# Patient Record
Sex: Female | Born: 1965
Health system: Southern US, Community
[De-identification: ages and names within clinical notes are randomized; demographics above are authoritative.]

## PROBLEM LIST (undated history)

## (undated) DIAGNOSIS — Z9889 Other specified postprocedural states: Secondary | ICD-10-CM

## (undated) DIAGNOSIS — R112 Nausea with vomiting, unspecified: Secondary | ICD-10-CM

## (undated) HISTORY — PX: TUBAL LIGATION: SHX77

## (undated) HISTORY — PX: APPENDECTOMY: SHX54

---

## 2006-03-21 ENCOUNTER — Ambulatory Visit (HOSPITAL_COMMUNITY): Admission: RE | Admit: 2006-03-21 | Discharge: 2006-03-21 | Payer: Self-pay | Admitting: *Deleted

## 2006-03-22 ENCOUNTER — Other Ambulatory Visit: Admission: RE | Admit: 2006-03-22 | Discharge: 2006-03-22 | Payer: Self-pay | Admitting: Family Medicine

## 2008-03-26 ENCOUNTER — Ambulatory Visit (HOSPITAL_COMMUNITY): Admission: RE | Admit: 2008-03-26 | Discharge: 2008-03-26 | Payer: Self-pay | Admitting: Family Medicine

## 2014-11-10 ENCOUNTER — Other Ambulatory Visit: Payer: Self-pay | Admitting: Gastroenterology

## 2014-11-19 ENCOUNTER — Other Ambulatory Visit: Payer: Self-pay | Admitting: Family Medicine

## 2014-11-19 DIAGNOSIS — R109 Unspecified abdominal pain: Secondary | ICD-10-CM

## 2014-11-21 ENCOUNTER — Ambulatory Visit
Admission: RE | Admit: 2014-11-21 | Discharge: 2014-11-21 | Disposition: A | Discharge status: Home / Self Care | Payer: BLUE CROSS/BLUE SHIELD | Source: Ambulatory Visit | Attending: Family Medicine | Admitting: Family Medicine

## 2014-11-21 DIAGNOSIS — R109 Unspecified abdominal pain: Secondary | ICD-10-CM

## 2014-11-27 ENCOUNTER — Encounter (HOSPITAL_COMMUNITY): Payer: Self-pay | Admitting: *Deleted

## 2014-12-11 NOTE — H&P (Signed)
  Stephanie Shepard HPI: The patient reported an issues with hematochezia two months ago. She noticed some blood on the toilet tissue and no further bleeding since that time. There is no issues with diarrhea or constipation on a routine basis. In the recent past she also complained of right upper quadrant pain that precipitated diarrhea. She thinks she had 7 of these attacks in the past and work up for her gallbladder was negative. An ultrasound of the abdomen was performed, but it was negative. Her family history is significant for one sister with pancreatic cancer, one sister with pancreatic cancer and breast cancer Evelena Peat(BRACA-), and her mother with duodenal cancer. Her sisters are close in age with her.  Past Medical History  Diagnosis Date  . PONV (postoperative nausea and vomiting)     Past Surgical History  Procedure Laterality Date  . Appendectomy    . Cesarean section    . Tubal ligation      History reviewed. No pertinent family history.  Social History:  reports that she has never smoked. She has never used smokeless tobacco. She reports that she does not drink alcohol or use illicit drugs.  Allergies:  Allergies  Allergen Reactions  . Penicillins Hives    Has patient had a PCN reaction causing immediate rash, facial/tongue/throat swelling, SOB or lightheadedness with hypotension: HIVES (a week after taking the drug) Has patient had a PCN reaction causing severe rash involving mucus membranes or skin necrosis: No Has patient had a PCN reaction that required hospitalization No Has patient had a PCN reaction occurring within the last 10 years: no If all of the above answers are "NO", then may proceed with Cephalosporin use.   . Shrimp [Shellfish Allergy] Other (See Comments)    Throat swelling    Medications: Scheduled: Continuous:  No results found for this or any previous visit (from the past 24 hour(s)).   No results found.  ROS:  As stated above in the HPI otherwise  negative.  There were no vitals taken for this visit.    PE: Gen: NAD, Alert and Oriented HEENT:  [place]/AT, EOMI Neck: Supple, no LAD Lungs: CTA Bilaterally CV: RRR without M/G/R ABM: Soft, NTND, +BS Ext: No C/C/E  Assessment/Plan: 1) RUQ pain and family history of pancreatic cancer as well as duodenal cancer - EUS.  Margarite Vessel D 12/11/2014, 3:45 PM

## 2014-12-12 ENCOUNTER — Ambulatory Visit (HOSPITAL_COMMUNITY)
Admission: RE | Admit: 2014-12-12 | Discharge: 2014-12-12 | Disposition: A | Discharge status: Home / Self Care | Payer: BLUE CROSS/BLUE SHIELD | Source: Ambulatory Visit | Attending: Gastroenterology | Admitting: Gastroenterology

## 2014-12-12 ENCOUNTER — Encounter (HOSPITAL_COMMUNITY): Payer: Self-pay | Admitting: *Deleted

## 2014-12-12 ENCOUNTER — Encounter (HOSPITAL_COMMUNITY)
Admission: RE | Disposition: A | Discharge status: Home / Self Care | Payer: Self-pay | Source: Ambulatory Visit | Attending: Gastroenterology

## 2014-12-12 ENCOUNTER — Ambulatory Visit (HOSPITAL_COMMUNITY): Payer: BLUE CROSS/BLUE SHIELD | Admitting: Anesthesiology

## 2014-12-12 DIAGNOSIS — R1011 Right upper quadrant pain: Secondary | ICD-10-CM | POA: Insufficient documentation

## 2014-12-12 DIAGNOSIS — Z800 Family history of malignant neoplasm of digestive organs: Secondary | ICD-10-CM | POA: Insufficient documentation

## 2014-12-12 DIAGNOSIS — Z79899 Other long term (current) drug therapy: Secondary | ICD-10-CM | POA: Insufficient documentation

## 2014-12-12 DIAGNOSIS — K317 Polyp of stomach and duodenum: Secondary | ICD-10-CM | POA: Insufficient documentation

## 2014-12-12 DIAGNOSIS — Z91013 Allergy to seafood: Secondary | ICD-10-CM | POA: Insufficient documentation

## 2014-12-12 DIAGNOSIS — K219 Gastro-esophageal reflux disease without esophagitis: Secondary | ICD-10-CM | POA: Insufficient documentation

## 2014-12-12 DIAGNOSIS — Z880 Allergy status to penicillin: Secondary | ICD-10-CM | POA: Diagnosis not present

## 2014-12-12 HISTORY — DX: Nausea with vomiting, unspecified: R11.2

## 2014-12-12 HISTORY — DX: Nausea with vomiting, unspecified: Z98.890

## 2014-12-12 HISTORY — PX: EUS: SHX5427

## 2014-12-12 SURGERY — ULTRASOUND, UPPER GI TRACT, ENDOSCOPIC
Anesthesia: Monitor Anesthesia Care

## 2014-12-12 MED ORDER — MIDAZOLAM HCL 5 MG/5ML IJ SOLN
INTRAMUSCULAR | Status: DC | PRN
  Administered 2014-12-12: 2 mg via INTRAVENOUS

## 2014-12-12 MED ORDER — PROPOFOL 500 MG/50ML IV EMUL
INTRAVENOUS | Status: DC | PRN
  Administered 2014-12-12: 100 ug/kg/min via INTRAVENOUS

## 2014-12-12 MED ORDER — MIDAZOLAM HCL 2 MG/2ML IJ SOLN
INTRAMUSCULAR | Status: AC
  Filled 2014-12-12: qty 2

## 2014-12-12 MED ORDER — PROPOFOL 10 MG/ML IV BOLUS
INTRAVENOUS | Status: AC
  Filled 2014-12-12: qty 40

## 2014-12-12 MED ORDER — LACTATED RINGERS IV SOLN
INTRAVENOUS | Status: DC
  Administered 2014-12-12: 1000 mL via INTRAVENOUS

## 2014-12-12 MED ORDER — SODIUM CHLORIDE 0.9 % IV SOLN: INTRAVENOUS | Status: DC

## 2014-12-12 MED ORDER — PROPOFOL 10 MG/ML IV BOLUS
INTRAVENOUS | Status: DC | PRN
  Administered 2014-12-12: 50 mg via INTRAVENOUS
  Administered 2014-12-12 (×2): 20 mg via INTRAVENOUS
  Administered 2014-12-12: 50 mg via INTRAVENOUS

## 2014-12-12 NOTE — Transfer of Care (Signed)
Immediate Anesthesia Transfer of Care Note  Patient: Stephanie Shepard  Procedure(s) Performed: Procedure(s): FULL UPPER ENDOSCOPIC ULTRASOUND (EUS) RADIAL (N/A)  Patient Location: PACU  Anesthesia Type:MAC  Level of Consciousness:  sedated, patient cooperative and responds to stimulation  Airway & Oxygen Therapy:Patient Spontanous Breathing and Patient connected to face mask oxgen  Post-op Assessment:  Report given to PACU RN and Post -op Vital signs reviewed and stable  Post vital signs:  Reviewed and stable  Last Vitals:  Filed Vitals:   12/12/14 0959  BP: 134/75  Pulse: 67  Temp: 36.4 C  Resp: 16    Complications: No apparent anesthesia complications

## 2014-12-12 NOTE — Anesthesia Postprocedure Evaluation (Signed)
Anesthesia Post Note  Patient: Stephanie Shepard  Procedure(s) Performed: Procedure(s) (LRB): FULL UPPER ENDOSCOPIC ULTRASOUND (EUS) RADIAL (N/A)  Patient location during evaluation: PACU Anesthesia Type: MAC Level of consciousness: awake and alert Pain management: pain level controlled Vital Signs Assessment: post-procedure vital signs reviewed and stable Respiratory status: spontaneous breathing Cardiovascular status: blood pressure returned to baseline Anesthetic complications: no    Last Vitals:  Filed Vitals:   12/12/14 1310 12/12/14 1320  BP: 104/69 106/63  Pulse: 53 53  Temp:    Resp: 15 19    Last Pain: There were no vitals filed for this visit.               Kennieth RadFitzgerald, Kordell Jafri E

## 2014-12-12 NOTE — Discharge Instructions (Signed)

## 2014-12-12 NOTE — Op Note (Signed)
[facility] [address] [place], [address]   UPPER ENDOSCOPIC ULTRASOUND PROCEDURE REPORT     EXAM DATE: 12/12/2014  PATIENT NAME:          Stephanie Shepard, Stephanie Shepard          MR#:        [id number]  BIRTHDATE:       1965-08-22     VISIT #:     [id number] ATTENDING:     Jeani Hawking, MD     STATUS:     outpatient ASSISTANT:      Omelia Blackwater and Runell Gess MD: ASA CLASS:        Class I  INDICATIONS:  The patient is a 49 yr old female here for a lower endoscopic ultrasound due to RUQ pain and family history of duodenal and pancreatic cancer. PROCEDURE PERFORMED:     EUS and EGD with snare polypectomy   MEDICATIONS:     Monitored anesthesia care  CONSENT: The patient understands the risks and benefits of the procedure and understands that these risks include, but are not limited to: sedation, allergic reaction, infection, perforation and/or bleeding. Alternative means of evaluation and treatment include, among others: physical exam, x-rays, and/or surgical intervention. The patient elects to proceed with this endoscopic procedure.  DESCRIPTION OF PROCEDURE: During intra-op preparation period all mechanical & medical equipment was checked for proper function. Hand hygiene and appropriate measures for infection prevention was taken. After the risks, benefits and alternatives of the procedure were thoroughly explained, Informed consent was verified, confirmed and timeout was successfully executed by the treatment team. The patient was then placed in the left, lateral, decubitus position and IV sedation was administered. Throughout the procedure, the patients blood pressure, pulse and oxygen saturations were monitored continuously. Under direct visualization, the    was introduced through the mouth and advanced to the second portion of the duodenum.  Water was used as necessary to provide an acoustic interface. The pulse, BP, and O2 saturation  were monitored and documented by the physician and nursing staff throughout the procedure. Upon completion of the imaging, water was removed and the patient was then discharged to recovery in stable condition with the appropriate post procedure care. Estimated blood loss is zero unless otherwise noted in this procedure report.   FINDINGS: A close and careful examination of the upper GI structures were performed.  The pancreas was normal in echotexture in the head, uncinate, neck, body and tail (Images 1-5).  No evidence of any masses or cystic lesions.  The PD and CBD were small, but normal in caliber.  The liver was normal.  The gallbladder was visualized, but the image was hazy (Image 13).  However, there was no evidence of any overt abnormalities.  A sharp angulation was noted from D1 to D2 and the echoendoscope was not able to navigate through the area safely.  An upper adult endoscope was then used. The duodenal lumen was normal and the sharp angulation was simply an anatomical variation.  In the gastric lumen several polyps were identified measuring 3-4 mm.  Several representative samples were obrtained with a snare.  This is most likely from her use of omeprazole.Marland Kitchen    ADVERSE EVENTS:     There were no immediate complications. IMPRESSIONS:     1) Normal pancreas and hepatobiliary system. 2) Gastric polyps.  RECOMMENDATIONS:     1) Follow up biopsies.  ___________________________________ Jeani Hawking, MD eSigned:  Jeani Hawking, MD  12/12/2014 12:55 PM   cc:      PATIENT NAME:  Stephanie Shepard, Stephanie Shepard MR#: [id number]

## 2014-12-12 NOTE — Anesthesia Preprocedure Evaluation (Signed)
Anesthesia Evaluation  Patient identified by MRN, date of birth, ID band Patient awake    Reviewed: Allergy & Precautions, NPO status , Patient's Chart, lab work & pertinent test results  History of Anesthesia Complications (+) PONV  Airway Mallampati: I  TM Distance: >3 FB Neck ROM: Full    Dental   Pulmonary neg pulmonary ROS,    breath sounds clear to auscultation       Cardiovascular negative cardio ROS   Rhythm:Regular Rate:Normal     Neuro/Psych negative neurological ROS     GI/Hepatic negative GI ROS, Neg liver ROS,   Endo/Other  negative endocrine ROS  Renal/GU negative Renal ROS     Musculoskeletal negative musculoskeletal ROS (+)   Abdominal   Peds  Hematology negative hematology ROS (+)   Anesthesia Other Findings   Reproductive/Obstetrics                            No results found for: WBC, HGB, HCT, MCV, PLT No results found for: CREATININE, BUN, NA, K, CL, CO2  Anesthesia Physical Anesthesia Plan  ASA: I  Anesthesia Plan: MAC   Post-op Pain Management:    Induction: Intravenous  Airway Management Planned: Natural Airway and Nasal Cannula  Additional Equipment:   Intra-op Plan:   Post-operative Plan:   Informed Consent: I have reviewed the patients History and Physical, chart, labs and discussed the procedure including the risks, benefits and alternatives for the proposed anesthesia with the patient or authorized representative who has indicated his/her understanding and acceptance.     Plan Discussed with: CRNA  Anesthesia Plan Comments:         Anesthesia Quick Evaluation

## 2014-12-15 ENCOUNTER — Encounter (HOSPITAL_COMMUNITY): Payer: Self-pay | Admitting: Gastroenterology

## 2015-04-28 ENCOUNTER — Ambulatory Visit
Admission: RE | Admit: 2015-04-28 | Discharge: 2015-04-28 | Disposition: A | Discharge status: Home / Self Care | Payer: BLUE CROSS/BLUE SHIELD | Source: Ambulatory Visit | Attending: Family Medicine | Admitting: Family Medicine

## 2015-04-28 ENCOUNTER — Other Ambulatory Visit: Payer: Self-pay | Admitting: Family Medicine

## 2015-04-28 DIAGNOSIS — IMO0002 Reserved for concepts with insufficient information to code with codable children: Secondary | ICD-10-CM

## 2015-04-28 DIAGNOSIS — D229 Melanocytic nevi, unspecified: Secondary | ICD-10-CM | POA: Diagnosis not present

## 2015-04-28 DIAGNOSIS — R52 Pain, unspecified: Secondary | ICD-10-CM

## 2015-04-28 DIAGNOSIS — Z91018 Allergy to other foods: Secondary | ICD-10-CM | POA: Diagnosis not present

## 2015-04-28 DIAGNOSIS — R229 Localized swelling, mass and lump, unspecified: Principal | ICD-10-CM

## 2015-04-28 DIAGNOSIS — M2550 Pain in unspecified joint: Secondary | ICD-10-CM | POA: Diagnosis not present

## 2015-04-28 DIAGNOSIS — Z6824 Body mass index (BMI) 24.0-24.9, adult: Secondary | ICD-10-CM | POA: Diagnosis not present

## 2015-04-28 DIAGNOSIS — M79622 Pain in left upper arm: Secondary | ICD-10-CM | POA: Diagnosis not present

## 2015-06-17 DIAGNOSIS — N959 Unspecified menopausal and perimenopausal disorder: Secondary | ICD-10-CM | POA: Diagnosis not present

## 2015-06-17 DIAGNOSIS — N760 Acute vaginitis: Secondary | ICD-10-CM | POA: Diagnosis not present

## 2015-06-17 DIAGNOSIS — R634 Abnormal weight loss: Secondary | ICD-10-CM | POA: Diagnosis not present

## 2015-06-17 DIAGNOSIS — L659 Nonscarring hair loss, unspecified: Secondary | ICD-10-CM | POA: Diagnosis not present

## 2015-06-17 DIAGNOSIS — M2550 Pain in unspecified joint: Secondary | ICD-10-CM | POA: Diagnosis not present

## 2015-06-18 ENCOUNTER — Other Ambulatory Visit: Payer: Self-pay | Admitting: Family Medicine

## 2015-07-01 ENCOUNTER — Other Ambulatory Visit: Payer: Self-pay | Admitting: Family Medicine

## 2015-07-02 ENCOUNTER — Other Ambulatory Visit: Payer: Self-pay | Admitting: Family Medicine

## 2015-07-02 DIAGNOSIS — R229 Localized swelling, mass and lump, unspecified: Principal | ICD-10-CM

## 2015-07-02 DIAGNOSIS — IMO0002 Reserved for concepts with insufficient information to code with codable children: Secondary | ICD-10-CM

## 2015-07-10 ENCOUNTER — Ambulatory Visit
Admission: RE | Admit: 2015-07-10 | Discharge: 2015-07-10 | Disposition: A | Discharge status: Home / Self Care | Payer: BLUE CROSS/BLUE SHIELD | Source: Ambulatory Visit | Attending: Family Medicine | Admitting: Family Medicine

## 2015-07-10 DIAGNOSIS — IMO0002 Reserved for concepts with insufficient information to code with codable children: Secondary | ICD-10-CM

## 2015-07-10 DIAGNOSIS — R229 Localized swelling, mass and lump, unspecified: Principal | ICD-10-CM

## 2015-07-10 DIAGNOSIS — R2232 Localized swelling, mass and lump, left upper limb: Secondary | ICD-10-CM | POA: Diagnosis not present

## 2015-07-10 MED ORDER — GADOBENATE DIMEGLUMINE 529 MG/ML IV SOLN
12.0000 mL | Freq: Once | INTRAVENOUS | Status: AC | PRN
  Administered 2015-07-10: 12 mL via INTRAVENOUS

## 2015-08-06 DIAGNOSIS — Z23 Encounter for immunization: Secondary | ICD-10-CM | POA: Diagnosis not present

## 2015-10-07 DIAGNOSIS — L309 Dermatitis, unspecified: Secondary | ICD-10-CM | POA: Diagnosis not present

## 2015-10-07 DIAGNOSIS — L659 Nonscarring hair loss, unspecified: Secondary | ICD-10-CM | POA: Diagnosis not present

## 2015-10-07 DIAGNOSIS — Z23 Encounter for immunization: Secondary | ICD-10-CM | POA: Diagnosis not present

## 2015-10-27 DIAGNOSIS — Z1231 Encounter for screening mammogram for malignant neoplasm of breast: Secondary | ICD-10-CM | POA: Diagnosis not present

## 2015-10-27 DIAGNOSIS — Z01419 Encounter for gynecological examination (general) (routine) without abnormal findings: Secondary | ICD-10-CM | POA: Diagnosis not present

## 2015-10-27 DIAGNOSIS — Z6823 Body mass index (BMI) 23.0-23.9, adult: Secondary | ICD-10-CM | POA: Diagnosis not present

## 2015-12-09 DIAGNOSIS — L309 Dermatitis, unspecified: Secondary | ICD-10-CM | POA: Diagnosis not present

## 2015-12-09 DIAGNOSIS — Z6823 Body mass index (BMI) 23.0-23.9, adult: Secondary | ICD-10-CM | POA: Diagnosis not present

## 2015-12-09 DIAGNOSIS — L659 Nonscarring hair loss, unspecified: Secondary | ICD-10-CM | POA: Diagnosis not present

## 2016-02-02 DIAGNOSIS — Z136 Encounter for screening for cardiovascular disorders: Secondary | ICD-10-CM | POA: Diagnosis not present

## 2016-02-02 DIAGNOSIS — Z0000 Encounter for general adult medical examination without abnormal findings: Secondary | ICD-10-CM | POA: Diagnosis not present

## 2016-02-04 DIAGNOSIS — Z6824 Body mass index (BMI) 24.0-24.9, adult: Secondary | ICD-10-CM | POA: Diagnosis not present

## 2016-02-04 DIAGNOSIS — Z23 Encounter for immunization: Secondary | ICD-10-CM | POA: Diagnosis not present

## 2016-02-04 DIAGNOSIS — Z0000 Encounter for general adult medical examination without abnormal findings: Secondary | ICD-10-CM | POA: Diagnosis not present

## 2016-03-07 DIAGNOSIS — K5792 Diverticulitis of intestine, part unspecified, without perforation or abscess without bleeding: Secondary | ICD-10-CM | POA: Diagnosis not present

## 2016-03-07 DIAGNOSIS — N390 Urinary tract infection, site not specified: Secondary | ICD-10-CM | POA: Diagnosis not present

## 2016-03-07 DIAGNOSIS — Z6823 Body mass index (BMI) 23.0-23.9, adult: Secondary | ICD-10-CM | POA: Diagnosis not present

## 2016-03-09 DIAGNOSIS — N926 Irregular menstruation, unspecified: Secondary | ICD-10-CM | POA: Diagnosis not present

## 2016-03-09 DIAGNOSIS — K5792 Diverticulitis of intestine, part unspecified, without perforation or abscess without bleeding: Secondary | ICD-10-CM | POA: Diagnosis not present

## 2016-03-09 DIAGNOSIS — N390 Urinary tract infection, site not specified: Secondary | ICD-10-CM | POA: Diagnosis not present

## 2016-03-09 DIAGNOSIS — Z6823 Body mass index (BMI) 23.0-23.9, adult: Secondary | ICD-10-CM | POA: Diagnosis not present

## 2016-03-28 DIAGNOSIS — N926 Irregular menstruation, unspecified: Secondary | ICD-10-CM | POA: Diagnosis not present

## 2016-03-28 DIAGNOSIS — K5792 Diverticulitis of intestine, part unspecified, without perforation or abscess without bleeding: Secondary | ICD-10-CM | POA: Diagnosis not present

## 2016-03-28 DIAGNOSIS — G479 Sleep disorder, unspecified: Secondary | ICD-10-CM | POA: Diagnosis not present

## 2016-03-28 DIAGNOSIS — Z6824 Body mass index (BMI) 24.0-24.9, adult: Secondary | ICD-10-CM | POA: Diagnosis not present

## 2016-05-02 DIAGNOSIS — D72818 Other decreased white blood cell count: Secondary | ICD-10-CM | POA: Diagnosis not present

## 2016-05-05 DIAGNOSIS — N959 Unspecified menopausal and perimenopausal disorder: Secondary | ICD-10-CM | POA: Diagnosis not present

## 2016-05-05 DIAGNOSIS — Z733 Stress, not elsewhere classified: Secondary | ICD-10-CM | POA: Diagnosis not present

## 2016-05-05 DIAGNOSIS — N926 Irregular menstruation, unspecified: Secondary | ICD-10-CM | POA: Diagnosis not present

## 2016-05-05 DIAGNOSIS — G479 Sleep disorder, unspecified: Secondary | ICD-10-CM | POA: Diagnosis not present

## 2016-08-05 DIAGNOSIS — N390 Urinary tract infection, site not specified: Secondary | ICD-10-CM | POA: Diagnosis not present

## 2016-11-02 DIAGNOSIS — Z23 Encounter for immunization: Secondary | ICD-10-CM | POA: Diagnosis not present

## 2017-01-23 DIAGNOSIS — Z800 Family history of malignant neoplasm of digestive organs: Secondary | ICD-10-CM | POA: Diagnosis not present

## 2017-01-23 DIAGNOSIS — Z6824 Body mass index (BMI) 24.0-24.9, adult: Secondary | ICD-10-CM | POA: Diagnosis not present

## 2017-01-23 DIAGNOSIS — Z01419 Encounter for gynecological examination (general) (routine) without abnormal findings: Secondary | ICD-10-CM | POA: Diagnosis not present

## 2017-01-23 DIAGNOSIS — Z803 Family history of malignant neoplasm of breast: Secondary | ICD-10-CM | POA: Diagnosis not present

## 2017-01-23 DIAGNOSIS — Z808 Family history of malignant neoplasm of other organs or systems: Secondary | ICD-10-CM | POA: Diagnosis not present

## 2017-01-23 DIAGNOSIS — Z801 Family history of malignant neoplasm of trachea, bronchus and lung: Secondary | ICD-10-CM | POA: Diagnosis not present

## 2017-01-24 ENCOUNTER — Other Ambulatory Visit: Payer: Self-pay | Admitting: Obstetrics & Gynecology

## 2017-01-24 DIAGNOSIS — N644 Mastodynia: Secondary | ICD-10-CM

## 2017-01-27 ENCOUNTER — Ambulatory Visit: Payer: BLUE CROSS/BLUE SHIELD

## 2017-01-27 ENCOUNTER — Ambulatory Visit
Admission: RE | Admit: 2017-01-27 | Discharge: 2017-01-27 | Disposition: A | Discharge status: Home / Self Care | Payer: BLUE CROSS/BLUE SHIELD | Source: Ambulatory Visit | Attending: Obstetrics & Gynecology | Admitting: Obstetrics & Gynecology

## 2017-01-27 DIAGNOSIS — R922 Inconclusive mammogram: Secondary | ICD-10-CM | POA: Diagnosis not present

## 2017-01-27 DIAGNOSIS — N644 Mastodynia: Secondary | ICD-10-CM

## 2017-02-06 DIAGNOSIS — Z1329 Encounter for screening for other suspected endocrine disorder: Secondary | ICD-10-CM | POA: Diagnosis not present

## 2017-02-06 DIAGNOSIS — Z13220 Encounter for screening for lipoid disorders: Secondary | ICD-10-CM | POA: Diagnosis not present

## 2017-02-06 DIAGNOSIS — Z0000 Encounter for general adult medical examination without abnormal findings: Secondary | ICD-10-CM | POA: Diagnosis not present

## 2017-02-06 DIAGNOSIS — Z114 Encounter for screening for human immunodeficiency virus [HIV]: Secondary | ICD-10-CM | POA: Diagnosis not present

## 2017-02-09 DIAGNOSIS — Z0110 Encounter for examination of ears and hearing without abnormal findings: Secondary | ICD-10-CM | POA: Diagnosis not present

## 2017-02-09 DIAGNOSIS — D72818 Other decreased white blood cell count: Secondary | ICD-10-CM | POA: Diagnosis not present

## 2017-02-09 DIAGNOSIS — J069 Acute upper respiratory infection, unspecified: Secondary | ICD-10-CM | POA: Diagnosis not present

## 2017-02-09 DIAGNOSIS — H9313 Tinnitus, bilateral: Secondary | ICD-10-CM | POA: Diagnosis not present

## 2017-02-09 DIAGNOSIS — D72819 Decreased white blood cell count, unspecified: Secondary | ICD-10-CM | POA: Diagnosis not present

## 2017-02-09 DIAGNOSIS — Z0000 Encounter for general adult medical examination without abnormal findings: Secondary | ICD-10-CM | POA: Diagnosis not present

## 2017-02-09 DIAGNOSIS — H9193 Unspecified hearing loss, bilateral: Secondary | ICD-10-CM | POA: Diagnosis not present

## 2017-02-09 DIAGNOSIS — Z6823 Body mass index (BMI) 23.0-23.9, adult: Secondary | ICD-10-CM | POA: Diagnosis not present

## 2017-02-28 DIAGNOSIS — D72819 Decreased white blood cell count, unspecified: Secondary | ICD-10-CM | POA: Diagnosis not present

## 2017-03-02 DIAGNOSIS — D72819 Decreased white blood cell count, unspecified: Secondary | ICD-10-CM | POA: Diagnosis not present

## 2017-03-02 DIAGNOSIS — Z6823 Body mass index (BMI) 23.0-23.9, adult: Secondary | ICD-10-CM | POA: Diagnosis not present

## 2017-03-09 DIAGNOSIS — Z809 Family history of malignant neoplasm, unspecified: Secondary | ICD-10-CM | POA: Diagnosis not present

## 2017-03-09 DIAGNOSIS — N644 Mastodynia: Secondary | ICD-10-CM | POA: Diagnosis not present

## 2017-03-13 ENCOUNTER — Other Ambulatory Visit: Payer: Self-pay | Admitting: Obstetrics & Gynecology

## 2017-03-13 DIAGNOSIS — Z803 Family history of malignant neoplasm of breast: Secondary | ICD-10-CM

## 2017-03-23 DIAGNOSIS — H903 Sensorineural hearing loss, bilateral: Secondary | ICD-10-CM | POA: Diagnosis not present

## 2017-03-23 DIAGNOSIS — H9313 Tinnitus, bilateral: Secondary | ICD-10-CM | POA: Diagnosis not present

## 2017-04-17 DIAGNOSIS — Z6823 Body mass index (BMI) 23.0-23.9, adult: Secondary | ICD-10-CM | POA: Diagnosis not present

## 2017-04-17 DIAGNOSIS — N390 Urinary tract infection, site not specified: Secondary | ICD-10-CM | POA: Diagnosis not present

## 2017-05-11 DIAGNOSIS — R319 Hematuria, unspecified: Secondary | ICD-10-CM | POA: Diagnosis not present

## 2017-05-11 DIAGNOSIS — H9313 Tinnitus, bilateral: Secondary | ICD-10-CM | POA: Diagnosis not present

## 2017-05-11 DIAGNOSIS — N926 Irregular menstruation, unspecified: Secondary | ICD-10-CM | POA: Diagnosis not present

## 2017-05-11 DIAGNOSIS — J309 Allergic rhinitis, unspecified: Secondary | ICD-10-CM | POA: Diagnosis not present

## 2017-06-21 DIAGNOSIS — Z6823 Body mass index (BMI) 23.0-23.9, adult: Secondary | ICD-10-CM | POA: Diagnosis not present

## 2017-06-21 DIAGNOSIS — R319 Hematuria, unspecified: Secondary | ICD-10-CM | POA: Diagnosis not present

## 2017-06-21 DIAGNOSIS — N390 Urinary tract infection, site not specified: Secondary | ICD-10-CM | POA: Diagnosis not present

## 2017-07-24 ENCOUNTER — Ambulatory Visit
Admission: RE | Admit: 2017-07-24 | Discharge: 2017-07-24 | Disposition: A | Discharge status: Home / Self Care | Payer: BLUE CROSS/BLUE SHIELD | Source: Ambulatory Visit | Attending: Obstetrics & Gynecology | Admitting: Obstetrics & Gynecology

## 2017-07-24 DIAGNOSIS — N6011 Diffuse cystic mastopathy of right breast: Secondary | ICD-10-CM | POA: Diagnosis not present

## 2017-07-24 DIAGNOSIS — Z803 Family history of malignant neoplasm of breast: Secondary | ICD-10-CM

## 2017-07-24 DIAGNOSIS — N6012 Diffuse cystic mastopathy of left breast: Secondary | ICD-10-CM | POA: Diagnosis not present

## 2017-07-24 MED ORDER — GADOBENATE DIMEGLUMINE 529 MG/ML IV SOLN
12.0000 mL | Freq: Once | INTRAVENOUS | Status: AC | PRN
  Administered 2017-07-24: 12 mL via INTRAVENOUS

## 2017-07-28 ENCOUNTER — Other Ambulatory Visit: Payer: Self-pay | Admitting: Obstetrics & Gynecology

## 2017-07-28 DIAGNOSIS — R9389 Abnormal findings on diagnostic imaging of other specified body structures: Secondary | ICD-10-CM

## 2017-08-04 ENCOUNTER — Ambulatory Visit
Admission: RE | Admit: 2017-08-04 | Discharge: 2017-08-04 | Disposition: A | Discharge status: Home / Self Care | Payer: BLUE CROSS/BLUE SHIELD | Source: Ambulatory Visit | Attending: Obstetrics & Gynecology | Admitting: Obstetrics & Gynecology

## 2017-08-04 DIAGNOSIS — N6011 Diffuse cystic mastopathy of right breast: Secondary | ICD-10-CM | POA: Diagnosis not present

## 2017-08-04 DIAGNOSIS — N6489 Other specified disorders of breast: Secondary | ICD-10-CM | POA: Diagnosis not present

## 2017-08-04 DIAGNOSIS — R9389 Abnormal findings on diagnostic imaging of other specified body structures: Secondary | ICD-10-CM

## 2017-08-04 MED ORDER — GADOBENATE DIMEGLUMINE 529 MG/ML IV SOLN
12.0000 mL | Freq: Once | INTRAVENOUS | Status: AC | PRN
  Administered 2017-08-04: 12 mL via INTRAVENOUS

## 2017-08-24 DIAGNOSIS — R3121 Asymptomatic microscopic hematuria: Secondary | ICD-10-CM | POA: Diagnosis not present

## 2017-09-06 DIAGNOSIS — N200 Calculus of kidney: Secondary | ICD-10-CM | POA: Diagnosis not present

## 2017-09-06 DIAGNOSIS — N281 Cyst of kidney, acquired: Secondary | ICD-10-CM | POA: Diagnosis not present

## 2017-09-06 DIAGNOSIS — R3121 Asymptomatic microscopic hematuria: Secondary | ICD-10-CM | POA: Diagnosis not present

## 2017-09-07 DIAGNOSIS — R3121 Asymptomatic microscopic hematuria: Secondary | ICD-10-CM | POA: Diagnosis not present

## 2017-11-17 DIAGNOSIS — Z23 Encounter for immunization: Secondary | ICD-10-CM | POA: Diagnosis not present

## 2018-01-22 ENCOUNTER — Other Ambulatory Visit: Payer: Self-pay | Admitting: Obstetrics & Gynecology

## 2018-02-05 DIAGNOSIS — Z01419 Encounter for gynecological examination (general) (routine) without abnormal findings: Secondary | ICD-10-CM | POA: Diagnosis not present

## 2018-02-05 DIAGNOSIS — Z6824 Body mass index (BMI) 24.0-24.9, adult: Secondary | ICD-10-CM | POA: Diagnosis not present

## 2018-02-08 DIAGNOSIS — Z13220 Encounter for screening for lipoid disorders: Secondary | ICD-10-CM | POA: Diagnosis not present

## 2018-02-08 DIAGNOSIS — Z114 Encounter for screening for human immunodeficiency virus [HIV]: Secondary | ICD-10-CM | POA: Diagnosis not present

## 2018-02-08 DIAGNOSIS — Z1329 Encounter for screening for other suspected endocrine disorder: Secondary | ICD-10-CM | POA: Diagnosis not present

## 2018-02-08 DIAGNOSIS — Z0000 Encounter for general adult medical examination without abnormal findings: Secondary | ICD-10-CM | POA: Diagnosis not present

## 2018-02-14 DIAGNOSIS — Z0000 Encounter for general adult medical examination without abnormal findings: Secondary | ICD-10-CM | POA: Diagnosis not present

## 2018-02-14 DIAGNOSIS — Z6824 Body mass index (BMI) 24.0-24.9, adult: Secondary | ICD-10-CM | POA: Diagnosis not present

## 2018-02-14 DIAGNOSIS — Z23 Encounter for immunization: Secondary | ICD-10-CM | POA: Diagnosis not present

## 2018-03-05 ENCOUNTER — Other Ambulatory Visit: Payer: Self-pay | Admitting: Obstetrics & Gynecology

## 2018-03-05 DIAGNOSIS — Z1231 Encounter for screening mammogram for malignant neoplasm of breast: Secondary | ICD-10-CM

## 2018-03-07 ENCOUNTER — Ambulatory Visit
Admission: RE | Admit: 2018-03-07 | Discharge: 2018-03-07 | Disposition: A | Discharge status: Home / Self Care | Payer: BLUE CROSS/BLUE SHIELD | Source: Ambulatory Visit | Attending: Obstetrics & Gynecology | Admitting: Obstetrics & Gynecology

## 2018-03-07 DIAGNOSIS — Z1231 Encounter for screening mammogram for malignant neoplasm of breast: Secondary | ICD-10-CM

## 2018-03-08 ENCOUNTER — Other Ambulatory Visit: Payer: Self-pay | Admitting: Obstetrics & Gynecology

## 2018-03-08 DIAGNOSIS — R928 Other abnormal and inconclusive findings on diagnostic imaging of breast: Secondary | ICD-10-CM

## 2018-03-13 ENCOUNTER — Other Ambulatory Visit: Payer: Self-pay | Admitting: Obstetrics & Gynecology

## 2018-03-13 ENCOUNTER — Ambulatory Visit
Admission: RE | Admit: 2018-03-13 | Discharge: 2018-03-13 | Disposition: A | Discharge status: Home / Self Care | Payer: BLUE CROSS/BLUE SHIELD | Source: Ambulatory Visit | Attending: Obstetrics & Gynecology | Admitting: Obstetrics & Gynecology

## 2018-03-13 DIAGNOSIS — R928 Other abnormal and inconclusive findings on diagnostic imaging of breast: Secondary | ICD-10-CM

## 2018-03-13 DIAGNOSIS — R921 Mammographic calcification found on diagnostic imaging of breast: Secondary | ICD-10-CM | POA: Diagnosis not present

## 2018-03-13 DIAGNOSIS — Z803 Family history of malignant neoplasm of breast: Secondary | ICD-10-CM | POA: Diagnosis not present

## 2018-03-15 ENCOUNTER — Ambulatory Visit
Admission: RE | Admit: 2018-03-15 | Discharge: 2018-03-15 | Disposition: A | Discharge status: Home / Self Care | Payer: BLUE CROSS/BLUE SHIELD | Source: Ambulatory Visit | Attending: Obstetrics & Gynecology | Admitting: Obstetrics & Gynecology

## 2018-03-15 DIAGNOSIS — R921 Mammographic calcification found on diagnostic imaging of breast: Secondary | ICD-10-CM

## 2018-03-15 DIAGNOSIS — N6012 Diffuse cystic mastopathy of left breast: Secondary | ICD-10-CM | POA: Diagnosis not present

## 2018-03-27 DIAGNOSIS — H903 Sensorineural hearing loss, bilateral: Secondary | ICD-10-CM | POA: Diagnosis not present

## 2018-03-27 DIAGNOSIS — H9313 Tinnitus, bilateral: Secondary | ICD-10-CM | POA: Diagnosis not present

## 2018-08-03 ENCOUNTER — Other Ambulatory Visit: Payer: Self-pay

## 2018-08-03 ENCOUNTER — Encounter (HOSPITAL_COMMUNITY): Payer: Self-pay | Admitting: Emergency Medicine

## 2018-08-03 ENCOUNTER — Encounter (HOSPITAL_COMMUNITY): Payer: Self-pay

## 2018-08-03 ENCOUNTER — Emergency Department (HOSPITAL_COMMUNITY)
Admission: EM | Admit: 2018-08-03 | Discharge: 2018-08-03 | Disposition: A | Discharge status: Home / Self Care | Payer: BC Managed Care – PPO | Attending: Emergency Medicine | Admitting: Emergency Medicine

## 2018-08-03 ENCOUNTER — Emergency Department (HOSPITAL_COMMUNITY): Payer: BC Managed Care – PPO

## 2018-08-03 ENCOUNTER — Ambulatory Visit (HOSPITAL_COMMUNITY)
Admission: EM | Admit: 2018-08-03 | Discharge: 2018-08-03 | Disposition: A | Discharge status: Home / Self Care | Payer: BC Managed Care – PPO | Source: Home / Self Care | Attending: Family Medicine | Admitting: Family Medicine

## 2018-08-03 DIAGNOSIS — N201 Calculus of ureter: Secondary | ICD-10-CM

## 2018-08-03 DIAGNOSIS — R109 Unspecified abdominal pain: Secondary | ICD-10-CM

## 2018-08-03 DIAGNOSIS — N132 Hydronephrosis with renal and ureteral calculous obstruction: Secondary | ICD-10-CM | POA: Insufficient documentation

## 2018-08-03 DIAGNOSIS — R911 Solitary pulmonary nodule: Secondary | ICD-10-CM | POA: Diagnosis not present

## 2018-08-03 DIAGNOSIS — R1031 Right lower quadrant pain: Secondary | ICD-10-CM | POA: Diagnosis present

## 2018-08-03 DIAGNOSIS — Z880 Allergy status to penicillin: Secondary | ICD-10-CM | POA: Diagnosis not present

## 2018-08-03 DIAGNOSIS — R319 Hematuria, unspecified: Secondary | ICD-10-CM

## 2018-08-03 LAB — CBC WITH DIFFERENTIAL/PLATELET
Abs Immature Granulocytes: 0.02 10*3/uL (ref 0.00–0.07)
Basophils Absolute: 0 10*3/uL (ref 0.0–0.1)
Basophils Relative: 1 %
Eosinophils Absolute: 0 10*3/uL (ref 0.0–0.5)
Eosinophils Relative: 0 %
HCT: 38.1 % (ref 36.0–46.0)
Hemoglobin: 12.4 g/dL (ref 12.0–15.0)
Immature Granulocytes: 0 %
Lymphocytes Relative: 9 %
Lymphs Abs: 0.5 10*3/uL — ABNORMAL LOW (ref 0.7–4.0)
MCH: 28.7 pg (ref 26.0–34.0)
MCHC: 32.5 g/dL (ref 30.0–36.0)
MCV: 88.2 fL (ref 80.0–100.0)
Monocytes Absolute: 0.2 10*3/uL (ref 0.1–1.0)
Monocytes Relative: 4 %
Neutro Abs: 5 10*3/uL (ref 1.7–7.7)
Neutrophils Relative %: 86 %
Platelets: 214 10*3/uL (ref 150–400)
RBC: 4.32 MIL/uL (ref 3.87–5.11)
RDW: 12.7 % (ref 11.5–15.5)
WBC: 5.7 10*3/uL (ref 4.0–10.5)
nRBC: 0 % (ref 0.0–0.2)

## 2018-08-03 LAB — COMPREHENSIVE METABOLIC PANEL
ALT: 22 U/L (ref 0–44)
AST: 24 U/L (ref 15–41)
Albumin: 3.9 g/dL (ref 3.5–5.0)
Alkaline Phosphatase: 35 U/L — ABNORMAL LOW (ref 38–126)
Anion gap: 11 (ref 5–15)
BUN: 18 mg/dL (ref 6–20)
CO2: 24 mmol/L (ref 22–32)
Calcium: 9.3 mg/dL (ref 8.9–10.3)
Chloride: 107 mmol/L (ref 98–111)
Creatinine, Ser: 1.27 mg/dL — ABNORMAL HIGH (ref 0.44–1.00)
GFR calc Af Amer: 57 mL/min — ABNORMAL LOW (ref >60)
GFR calc non Af Amer: 49 mL/min — ABNORMAL LOW (ref >60)
Glucose, Bld: 119 mg/dL — ABNORMAL HIGH (ref 70–99)
Potassium: 4 mmol/L (ref 3.5–5.1)
Sodium: 142 mmol/L (ref 135–145)
Total Bilirubin: 0.8 mg/dL (ref 0.3–1.2)
Total Protein: 6.3 g/dL — ABNORMAL LOW (ref 6.5–8.1)

## 2018-08-03 LAB — POCT URINALYSIS DIP (DEVICE)
Glucose, UA: NEGATIVE mg/dL
Leukocytes,Ua: NEGATIVE
Nitrite: NEGATIVE
Protein, ur: >=300 mg/dL — AB
Specific Gravity, Urine: >=1.03 (ref 1.005–1.030)
Urobilinogen, UA: 0.2 mg/dL (ref 0.0–1.0)
pH: 5.5 (ref 5.0–8.0)

## 2018-08-03 LAB — URINALYSIS, ROUTINE W REFLEX MICROSCOPIC
Bilirubin Urine: NEGATIVE
Glucose, UA: NEGATIVE mg/dL
Ketones, ur: 5 mg/dL — AB
Leukocytes,Ua: NEGATIVE
Nitrite: NEGATIVE
Protein, ur: 100 mg/dL — AB
RBC / HPF: >50 RBC/hpf — ABNORMAL HIGH (ref 0–5)
Specific Gravity, Urine: 1.025 (ref 1.005–1.030)
pH: 5 (ref 5.0–8.0)

## 2018-08-03 LAB — LIPASE, BLOOD: Lipase: 30 U/L (ref 11–51)

## 2018-08-03 LAB — I-STAT BETA HCG BLOOD, ED (MC, WL, AP ONLY): I-stat hCG, quantitative: <5 m[IU]/mL (ref <5)

## 2018-08-03 MED ORDER — HYDROCODONE-ACETAMINOPHEN 5-325 MG PO TABS
1.0000 | ORAL_TABLET | Freq: Once | ORAL | Status: AC
  Administered 2018-08-03: 14:00:00 1 via ORAL
  Filled 2018-08-03: qty 1

## 2018-08-03 MED ORDER — ONDANSETRON HCL 4 MG/2ML IJ SOLN
4.0000 mg | Freq: Once | INTRAMUSCULAR | Status: AC
  Administered 2018-08-03: 4 mg via INTRAVENOUS
  Filled 2018-08-03: qty 2

## 2018-08-03 MED ORDER — HYDROCODONE-ACETAMINOPHEN 5-325 MG PO TABS
ORAL_TABLET | ORAL | Status: AC
  Filled 2018-08-03: qty 1

## 2018-08-03 MED ORDER — TAMSULOSIN HCL 0.4 MG PO CAPS: 0.4000 mg | ORAL_CAPSULE | Freq: Every day | ORAL | 0 refills | Status: DC

## 2018-08-03 MED ORDER — SODIUM CHLORIDE 0.9 % IV BOLUS
250.0000 mL | Freq: Once | INTRAVENOUS | Status: AC
  Administered 2018-08-03: 14:00:00 250 mL via INTRAVENOUS

## 2018-08-03 MED ORDER — KETOROLAC TROMETHAMINE 15 MG/ML IJ SOLN
15.0000 mg | Freq: Once | INTRAMUSCULAR | Status: AC
  Administered 2018-08-03: 15 mg via INTRAMUSCULAR
  Filled 2018-08-03: qty 1

## 2018-08-03 MED ORDER — HYDROCODONE-ACETAMINOPHEN 5-325 MG PO TABS
2.0000 | ORAL_TABLET | Freq: Once | ORAL | Status: AC
  Administered 2018-08-03: 2 via ORAL

## 2018-08-03 MED ORDER — ONDANSETRON 4 MG PO TBDP: 4.0000 mg | ORAL_TABLET | Freq: Three times a day (TID) | ORAL | 0 refills | Status: DC | PRN

## 2018-08-03 MED ORDER — HYDROCODONE-ACETAMINOPHEN 5-325 MG PO TABS: 1.0000 | ORAL_TABLET | Freq: Four times a day (QID) | ORAL | 0 refills | Status: DC | PRN

## 2018-08-03 MED ORDER — ONDANSETRON 4 MG PO TBDP
4.0000 mg | ORAL_TABLET | Freq: Once | ORAL | Status: AC
  Administered 2018-08-03: 4 mg via ORAL

## 2018-08-03 MED ORDER — MORPHINE SULFATE (PF) 4 MG/ML IV SOLN
4.0000 mg | Freq: Once | INTRAVENOUS | Status: AC
  Administered 2018-08-03: 11:00:00 4 mg via INTRAVENOUS
  Filled 2018-08-03: qty 1

## 2018-08-03 MED ORDER — ONDANSETRON 4 MG PO TBDP
ORAL_TABLET | ORAL | Status: AC
  Filled 2018-08-03: qty 1

## 2018-08-03 NOTE — ED Triage Notes (Signed)
Pt states "starting on Tuesday I haven't felt very well, I felt full, poor appetite, then I started having right flank pain that woke me up from out of my sleep." C/o nausea and vomited once this morning. Pt states she has not had any issue urinating. Denies hx of kidney stones.

## 2018-08-03 NOTE — ED Notes (Signed)
Pt back from CT

## 2018-08-03 NOTE — ED Provider Notes (Addendum)
[facility] Note   CSN: [id number] Arrival date & time: 08/03/18  0920    History   Chief Complaint Chief Complaint  Patient presents with   Flank Pain    HPI Stephanie Shepard is a 53 y.o. female presents today for kidney stone rule out from urgent care.  Patient reports sudden onset right flank pain that began at 3 AM this morning.  Describes a waxing and waning sharp throb severe in intensity without aggravating or alleviating factors.  Reports few episodes of hematuria.  Denies similar pain in the past.  She reports multiple episodes of nonbloody/nonbilious vomiting prior to arrival.   Denies fever/chills, diarrhea, chest pain/shortness of breath, fall/injury, numbness/weakness, tingling or any additional concerns. HPI  Past Medical History:  Diagnosis Date   PONV (postoperative nausea and vomiting)     There are no active problems to display for this patient.   Past Surgical History:  Procedure Laterality Date   APPENDECTOMY     CESAREAN SECTION     EUS N/A 12/12/2014   Procedure: FULL UPPER ENDOSCOPIC ULTRASOUND (EUS) RADIAL;  Surgeon: Jeani HawkingPatrick Hung, MD;  Location: [facility] ENDOSCOPY;  Service: Endoscopy;  Laterality: N/A;   TUBAL LIGATION       OB History   No obstetric history on file.      Home Medications    Prior to Admission medications   Medication Sig Start Date End Date Taking? Authorizing Provider  HYDROcodone-acetaminophen (NORCO/VICODIN) 5-325 MG tablet Take 1-2 tablets by mouth every 6 (six) hours as needed. 08/03/18   Harlene SaltsMorelli, Deina Lipsey A, PA-C  ondansetron (ZOFRAN ODT) 4 MG disintegrating tablet Take 1 tablet (4 mg total) by mouth every 8 (eight) hours as needed for nausea or vomiting. 08/03/18   Harlene SaltsMorelli, Drucilla Cumber A, PA-C  tamsulosin (FLOMAX) 0.4 MG CAPS capsule Take 1 capsule (0.4 mg total) by mouth daily. 08/03/18   Bill SalinasMorelli, Emillee Talsma A, PA-C    Family History Family History  Problem Relation Age of Onset     Breast cancer Sister [id number]   Breast cancer Paternal Aunt        before 4650   Breast cancer Maternal Grandmother        late 70's   Breast cancer Paternal Aunt        after 1650    Social History Social History   Tobacco Use   Smoking status: Never Smoker   Smokeless tobacco: Never Used  Substance Use Topics   Alcohol use: No   Drug use: No     Allergies   Penicillins and Shrimp [shellfish allergy]   Review of Systems Review of Systems Ten systems are reviewed and are negative for acute change except as noted in the HPI  Physical Exam Updated Vital Signs BP 137/64 (BP Location: Right Arm)    Pulse 72    Temp 98.1 F (36.7 C) (Oral)    Resp 16    Ht 5\' 3"  (1.6 m)    Wt 65.8 kg    LMP 08/03/2018 (Exact Date)    SpO2 99%    BMI 25.69 kg/m   Physical Exam Constitutional:      General: She is not in acute distress.    Appearance: Normal appearance. She is well-developed. She is not ill-appearing or diaphoretic.     Comments: Uncomfortable appearing  HENT:     Head: Normocephalic and atraumatic.     Right Ear: External ear normal.     Left Ear: External ear  normal.     Nose: Nose normal.  Eyes:     General: Vision grossly intact. Gaze aligned appropriately.     Pupils: Pupils are equal, round, and reactive to light.  Neck:     Musculoskeletal: Normal range of motion.     Trachea: Trachea and phonation normal. No tracheal deviation.  Pulmonary:     Effort: Pulmonary effort is normal. No respiratory distress.  Abdominal:     General: There is no distension.     Palpations: Abdomen is soft.     Tenderness: There is no abdominal tenderness. There is right CVA tenderness. There is no left CVA tenderness, guarding or rebound.  Musculoskeletal: Normal range of motion.  Skin:    General: Skin is warm and dry.  Neurological:     Mental Status: She is alert.     GCS: GCS eye subscore is 4. GCS verbal subscore is 5. GCS motor subscore is 6.     Comments: Speech is  clear and goal oriented, follows commands Major Cranial nerves without deficit, no facial droop Moves extremities without ataxia, coordination intact Normal gait  Psychiatric:        Behavior: Behavior normal.    ED Treatments / Results  Labs (all labs ordered are listed, but only abnormal results are displayed) Labs Reviewed  CBC WITH DIFFERENTIAL/PLATELET - Abnormal; Notable for the following components:      Result Value   Lymphs Abs 0.5 (*)    All other components within normal limits  COMPREHENSIVE METABOLIC PANEL - Abnormal; Notable for the following components:   Glucose, Bld 119 (*)    Creatinine, Ser 1.27 (*)    Total Protein 6.3 (*)    Alkaline Phosphatase 35 (*)    GFR calc non Af Amer 49 (*)    GFR calc Af Amer 57 (*)    All other components within normal limits  URINALYSIS, ROUTINE W REFLEX MICROSCOPIC - Abnormal; Notable for the following components:   APPearance HAZY (*)    Hgb urine dipstick LARGE (*)    Ketones, ur 5 (*)    Protein, ur 100 (*)    RBC / HPF >50 (*)    Bacteria, UA RARE (*)    All other components within normal limits  LIPASE, BLOOD  I-STAT BETA HCG BLOOD, ED ([facility], [facility], [facility] ONLY)    EKG None  Radiology Ct Renal Stone Study  Result Date: 08/03/2018 CLINICAL DATA:  Right flank pain beginning today. EXAM: CT ABDOMEN AND PELVIS WITHOUT CONTRAST TECHNIQUE: Multidetector CT imaging of the abdomen and pelvis was performed following the standard protocol without IV contrast. COMPARISON:  09/06/2017 FINDINGS: Lower chest: 4 mm left lower lobe lung nodule (series 5, image 17), possibly partially visualized on the first image of the prior study. No basilar lung consolidation or pleural effusion. Hepatobiliary: No focal liver abnormality is seen. No gallstones, gallbladder wall thickening, or biliary dilatation. Pancreas: Unremarkable. Spleen: Unremarkable. Adrenals/Urinary Tract: Unremarkable adrenal glands. There is a 4 mm calculus in the proximal right  ureter resulting in mild hydronephrosis. This likely reflects interval migration of the right lower pole calculus on the prior study into the ureter. A small low-density lesion is again noted in the upper pole of the left kidney, characterized as a cyst on the prior contrast-enhanced CT. Unremarkable bladder. Stomach/Bowel: The stomach is unremarkable. There is no evidence of bowel obstruction or inflammation. Prior appendectomy is noted. Vascular/Lymphatic: No significant vascular findings are present. No enlarged abdominal or pelvic lymph  nodes. Reproductive: Chronic right-sided uterine mass compatible with a fibroid. No adnexal mass. Other: No intraperitoneal free fluid. Musculoskeletal: No acute osseous abnormality or suspicious osseous lesion. L5-S1 disc degeneration with moderate disc space narrowing, disc bulging, and endplate spurring resulting in mild bilateral neural foraminal stenosis. IMPRESSION: 1. 4 mm proximal right ureteral calculus with mild hydronephrosis. 2. 4 mm left lower lobe pulmonary nodule. No follow-up needed if patient is low-risk. Non-contrast chest CT can be considered in 12 months if patient is high-risk. This recommendation follows the consensus statement: Guidelines for Management of Incidental Pulmonary Nodules Detected on CT Images: From the Fleischner Society 2017; Radiology 2017; 284:228-243. Electronically Signed   By: Sebastian AcheAllen  Grady M.D.   On: 08/03/2018 10:44    Procedures Procedures (including critical care time)  Medications Ordered in ED Medications  morphine 4 MG/ML injection 4 mg (4 mg Intravenous Given 08/03/18 1046)  ondansetron (ZOFRAN) injection 4 mg (4 mg Intravenous Given 08/03/18 1046)  HYDROcodone-acetaminophen (NORCO/VICODIN) 5-325 MG per tablet 1 tablet (1 tablet Oral Given 08/03/18 1354)  ketorolac (TORADOL) 15 MG/ML injection 15 mg (15 mg Intramuscular Given 08/03/18 1351)  sodium chloride 0.9 % bolus 250 mL (250 mLs Intravenous New Bag/Given 08/03/18  1357)     Initial Impression / Assessment and Plan / ED Course  I have reviewed the triage vital signs and the nursing notes.  Pertinent labs & imaging results that were available during my care of the patient were reviewed by me and considered in my medical decision making (see chart for details).    CBC nonacute CMP with creatinine of 1.27, no prior to compare Lipase within normal limits Beta-hCG negative CT renal stone study: IMPRESSION: 1. 4 mm proximal right ureteral calculus with mild hydronephrosis. 2. 4 mm left lower lobe pulmonary nodule. No follow-up needed if patient is low-risk. Non-contrast chest CT can be considered in 12 months if patient is high-risk. This recommendation follows the consensus statement: Guidelines for Management of Incidental Pulmonary Nodules Detected on CT Images: From the Fleischner Society 2017; Radiology 2017; 284:228-243.  - Updated on imaging results today and states understanding.  Will start patient on Flomax and gave prescription for pain and nausea control PRN.  Urology follow-up.  No signs of infection on urinalysis, no nitrates, leukocytes or WBCs, no indication for antibiotics at this point.  Patient aware of elevated creatinine to increase water intake, to follow-up with PCP for repeat blood work in 1 week.  Additionally patient updated on pulmonary nodule today and to follow-up with PCP.  PMP reviewed patient without current narcotic prescriptions.  Discussed precautions regarding narcotics with the patient and she states understanding.  Discussed case with Dr. Juleen ChinaKohut, pain not controlled following morphine today, patient with mild elevated creatinine, will give patient small dose of 15 mg Toradol for renal colic and fluid bolus, transient elevation creatinine should improve with resolution of calculus. - Patient reassessed resting comfortably no acute distress reports great improvement of pain following Toradol today she is now requesting  discharge.  Eating and drinking here in the emergency department in no acute distress no recurrent nausea or vomiting.  At this time there does not appear to be any evidence of an acute emergency medical condition and the patient appears stable for discharge with appropriate outpatient follow up. Diagnosis was discussed with patient who verbalizes understanding of care plan and is agreeable to discharge. I have discussed return precautions with patient who verbalizes understanding of return precautions. Patient encouraged to follow-up with their  PCP and Urology. All questions answered.  Patient has been discharged in good condition.   Note: Portions of this report may have been transcribed using voice recognition software. Every effort was made to ensure accuracy; however, inadvertent computerized transcription errors may still be present. Final Clinical Impressions(s) / ED Diagnoses   Final diagnoses:  Ureteral calculus, right  Pulmonary nodule    ED Discharge Orders         Ordered    HYDROcodone-acetaminophen (NORCO/VICODIN) 5-325 MG tablet  Every 6 hours PRN     08/03/18 1456    ondansetron (ZOFRAN ODT) 4 MG disintegrating tablet  Every 8 hours PRN     08/03/18 1456    tamsulosin (FLOMAX) 0.4 MG CAPS capsule  Daily     08/03/18 1456           Bill SalinasMorelli, Debe Anfinson A, PA-C 08/03/18 1502    Bill SalinasMorelli, Amiayah Giebel A, PA-C 08/03/18 1503    Raeford RazorKohut, Stephen, MD 08/04/18 1544

## 2018-08-03 NOTE — Discharge Instructions (Addendum)
You have been diagnosed today with right-sided kidney stone.  At this time there does not appear to be the presence of an emergent medical condition, however there is always the potential for conditions to change. Please read and follow the below instructions.  Please return to the Emergency Department immediately for any new or worsening symptoms or if your symptoms do not improve within 2 days. Please be sure to follow up with your Primary Care Provider within one week regarding your visit today; please call their office to schedule an appointment even if you are feeling better for a follow-up visit. Please take the medication tamsulosin as prescribed to help facilitate passage of your kidney stone.  You may use the medication Zofran as prescribed to help with nausea and vomiting.  You may use the medication Norco as prescribed for severe pain.  Do not drive or operate machinery while taking Norco as will make you drowsy.  Do not drink alcohol or take other sedating medications with Norco as this will worsen side effects. Your creatinine was mildly elevated today, please inform your primary care provider of this is for repeat blood work is advised in the next 1-2 weeks for recheck of your kidney function.  Please drink plenty of water to help with kidneys. You have been given an NSAID-containing medication called Toradol today.  Do not take the medications including ibuprofen, Aleve, Advil, naproxen or other NSAID-containing medications for the next 2 days.  Please be sure to drink plenty of water over the next few days. Finally a 4 mm left lower lobe pulmonary nodule was on your CT scan, discussed this with your primary care provider during your next visit as follow-up CT scan may be needed in 12 months depending on risk status.  Get help right away if: You have a fever or chills. You get very bad pain. You get new pain in your belly (abdomen). You pass out (faint). You cannot pee. You continue  to have nausea and vomiting despite use of your medication as prescribed Any new/concerning or worsening symptoms.  Please read the additional information packets attached to your discharge summary.  Do not take your medicine if  develop an itchy rash, swelling in your mouth or lips, or difficulty breathing; call 911 and seek immediate emergency medical attention if this occurs.

## 2018-08-03 NOTE — ED Notes (Signed)
Patient is being discharged from the Urgent [place] and sent to the Emergency Department via wheelchair by staff. Per Dr Meda Coffee, patient is stable but in need of higher level of care due to possible kidney stones. Patient is aware and verbalizes understanding of plan of care.    Vitals:   08/03/18 0833  BP: (!) 145/74  Pulse: (!) 55  Resp: 18  Temp: 99.1 F (37.3 C)  SpO2: 100%

## 2018-08-03 NOTE — Discharge Instructions (Addendum)
With flank pain this severe and hematuria, I am sending you to the ER for a kidney stone

## 2018-08-03 NOTE — ED Provider Notes (Signed)
[employer]    CSN: [id number] Arrival date & time: 08/03/18  1308      History   Chief Complaint Chief Complaint  Patient presents with  . Appointment    830  . Flank Pain    HPI Stephanie Shepard is a 53 y.o. female.   HPI  Healthy 53 year old female.  On no medication.  Works as a Equities trader.  Has had some vague abdominal fullness and poor appetite for couple of days.  This morning at 3 AM awoke with severe left flank pain.  She had vomiting a couple times this morning.  No dysuria.  No frequency.  No fever or chills.  Never had kidney stones.  Never had pyelonephritis.  Past Medical History:  Diagnosis Date  . PONV (postoperative nausea and vomiting)     There are no active problems to display for this patient.   Past Surgical History:  Procedure Laterality Date  . APPENDECTOMY    . CESAREAN SECTION    . EUS N/A 12/12/2014   Procedure: FULL UPPER ENDOSCOPIC ULTRASOUND (EUS) RADIAL;  Surgeon: Carol Ada, MD;  Location: [facility];  Service: Endoscopy;  Laterality: N/A;  . TUBAL LIGATION      OB History   No obstetric history on file.      Home Medications    Prior to Admission medications   Not on File    Family History Family History  Problem Relation Age of Onset  . Breast cancer Sister 69  . Breast cancer Paternal Aunt        before 22  . Breast cancer Maternal Grandmother        late 6's  . Breast cancer Paternal Aunt        after 21    Social History Social History   Tobacco Use  . Smoking status: Never Smoker  . Smokeless tobacco: Never Used  Substance Use Topics  . Alcohol use: No  . Drug use: No     Allergies   Penicillins and Shrimp [shellfish allergy]   Review of Systems Review of Systems  Constitutional: Negative for chills and fever.  HENT: Negative for ear pain and sore throat.   Eyes: Negative for pain and visual disturbance.  Respiratory: Negative for cough and shortness of breath.    Cardiovascular: Negative for chest pain and palpitations.  Gastrointestinal: Positive for vomiting. Negative for abdominal pain.  Genitourinary: Positive for flank pain. Negative for dysuria and hematuria.  Musculoskeletal: Negative for arthralgias and back pain.  Skin: Negative for color change and rash.  Neurological: Negative for seizures and syncope.  All other systems reviewed and are negative.    Physical Exam Triage Vital Signs ED Triage Vitals  Enc Vitals Group     BP 08/03/18 0833 (!) 145/74     Pulse Rate 08/03/18 0833 (!) 55     Resp 08/03/18 0833 18     Temp 08/03/18 0833 99.1 F (37.3 C)     Temp src --      SpO2 08/03/18 0833 100 %     Weight --      Height --      Head Circumference --      Peak Flow --      Pain Score 08/03/18 0836 9     Pain Loc --      Pain Edu? --      Excl. in [place]? --    No data found.  Updated Vital Signs BP Marland Kitchen)  145/74   Pulse (!) 55   Temp 99.1 F (37.3 C)   Resp 18   SpO2 100%       Physical Exam Constitutional:      General: She is in acute distress.     Appearance: She is well-developed and normal weight.     Comments: Acutely uncomfortable.  Pacing floor.  In obvious pain  HENT:     Head: Normocephalic and atraumatic.  Eyes:     Conjunctiva/sclera: Conjunctivae normal.     Pupils: Pupils are equal, round, and reactive to light.  Neck:     Musculoskeletal: Normal range of motion.  Cardiovascular:     Rate and Rhythm: Normal rate.     Heart sounds: Normal heart sounds.  Pulmonary:     Effort: Pulmonary effort is normal. No respiratory distress.     Breath sounds: Normal breath sounds.  Abdominal:     General: Abdomen is flat. There is no distension.     Palpations: Abdomen is soft.     Tenderness: There is left CVA tenderness.  Musculoskeletal: Normal range of motion.  Skin:    General: Skin is warm and dry.  Neurological:     Mental Status: She is alert.      UC Treatments / Results  Labs (all labs  ordered are listed, but only abnormal results are displayed) Labs Reviewed  POCT URINALYSIS DIP (DEVICE) - Abnormal; Notable for the following components:      Result Value   Bilirubin Urine SMALL (*)    Ketones, ur TRACE (*)    Hgb urine dipstick LARGE (*)    Protein, ur >=300 (*)    All other components within normal limits    EKG   Radiology No results found.  Procedures Procedures (including critical care time)  Medications Ordered in UC Medications  HYDROcodone-acetaminophen (NORCO/VICODIN) 5-325 MG per tablet 2 tablet (has no administration in time range)    Initial Impression / Assessment and Plan / UC Course  I have reviewed the triage vital signs and the nursing notes.  Pertinent labs & imaging results that were available during my care of the patient were reviewed by me and considered in my medical decision making (see chart for details).     Patient does have hematuria.  She states that she is on her menstrual cycle.  She made every effort to clean off before giving the urine sample.  I see no evidence of infection in her urinalysis.  I believe she has nephrolithiasis and I am sending her to the emergency room for additional evaluation. Final Clinical Impressions(s) / UC Diagnoses   Final diagnoses:  Flank pain  Hematuria, unspecified type     Discharge Instructions     With flank pain this severe and hematuria, I am sending you to the ER for a kidney stone   ED Prescriptions    None     Controlled Substance Prescriptions [place] Controlled Substance Registry consulted? no   Eustace MooreNelson,  Sue, MD 08/03/18 [phone]

## 2018-08-03 NOTE — ED Notes (Signed)
Pt transported to CT ?

## 2018-08-03 NOTE — ED Triage Notes (Signed)
Pt from [facility] for kidney stone R/O. Pt has had hematuria and right sided flank pain x 4 days. Given zofran and hydrocodone pta. No hx of kidney stones. No difficulty urinating.

## 2018-08-06 DIAGNOSIS — N201 Calculus of ureter: Secondary | ICD-10-CM | POA: Diagnosis not present

## 2018-08-09 DIAGNOSIS — R8271 Bacteriuria: Secondary | ICD-10-CM | POA: Diagnosis not present

## 2018-08-09 DIAGNOSIS — N201 Calculus of ureter: Secondary | ICD-10-CM | POA: Diagnosis not present

## 2018-08-13 DIAGNOSIS — N200 Calculus of kidney: Secondary | ICD-10-CM | POA: Diagnosis not present

## 2018-08-13 DIAGNOSIS — J984 Other disorders of lung: Secondary | ICD-10-CM | POA: Diagnosis not present

## 2018-08-13 DIAGNOSIS — D72819 Decreased white blood cell count, unspecified: Secondary | ICD-10-CM | POA: Diagnosis not present

## 2018-08-13 DIAGNOSIS — N3289 Other specified disorders of bladder: Secondary | ICD-10-CM | POA: Diagnosis not present

## 2018-08-20 DIAGNOSIS — N201 Calculus of ureter: Secondary | ICD-10-CM | POA: Diagnosis not present

## 2018-08-21 DIAGNOSIS — Z23 Encounter for immunization: Secondary | ICD-10-CM | POA: Diagnosis not present

## 2018-09-18 DIAGNOSIS — N201 Calculus of ureter: Secondary | ICD-10-CM | POA: Diagnosis not present

## 2018-12-26 DIAGNOSIS — Z23 Encounter for immunization: Secondary | ICD-10-CM | POA: Diagnosis not present

## 2019-02-15 ENCOUNTER — Other Ambulatory Visit: Payer: Self-pay | Admitting: Obstetrics & Gynecology

## 2019-02-15 DIAGNOSIS — Z0000 Encounter for general adult medical examination without abnormal findings: Secondary | ICD-10-CM | POA: Diagnosis not present

## 2019-02-15 DIAGNOSIS — Z1231 Encounter for screening mammogram for malignant neoplasm of breast: Secondary | ICD-10-CM

## 2019-02-15 DIAGNOSIS — Z0184 Encounter for antibody response examination: Secondary | ICD-10-CM | POA: Diagnosis not present

## 2019-02-19 DIAGNOSIS — J309 Allergic rhinitis, unspecified: Secondary | ICD-10-CM | POA: Diagnosis not present

## 2019-02-19 DIAGNOSIS — Z1211 Encounter for screening for malignant neoplasm of colon: Secondary | ICD-10-CM | POA: Diagnosis not present

## 2019-02-19 DIAGNOSIS — Z6828 Body mass index (BMI) 28.0-28.9, adult: Secondary | ICD-10-CM | POA: Diagnosis not present

## 2019-02-19 DIAGNOSIS — R0789 Other chest pain: Secondary | ICD-10-CM | POA: Diagnosis not present

## 2019-02-19 DIAGNOSIS — R635 Abnormal weight gain: Secondary | ICD-10-CM | POA: Diagnosis not present

## 2019-02-19 DIAGNOSIS — Z0000 Encounter for general adult medical examination without abnormal findings: Secondary | ICD-10-CM | POA: Diagnosis not present

## 2019-02-19 DIAGNOSIS — M940 Chondrocostal junction syndrome [Tietze]: Secondary | ICD-10-CM | POA: Diagnosis not present

## 2019-02-25 ENCOUNTER — Other Ambulatory Visit: Payer: Self-pay | Admitting: Family Medicine

## 2019-02-25 DIAGNOSIS — N644 Mastodynia: Secondary | ICD-10-CM

## 2019-02-27 DIAGNOSIS — Z6828 Body mass index (BMI) 28.0-28.9, adult: Secondary | ICD-10-CM | POA: Diagnosis not present

## 2019-02-27 DIAGNOSIS — Z01419 Encounter for gynecological examination (general) (routine) without abnormal findings: Secondary | ICD-10-CM | POA: Diagnosis not present

## 2019-03-11 ENCOUNTER — Other Ambulatory Visit: Payer: Self-pay

## 2019-03-11 ENCOUNTER — Ambulatory Visit
Admission: RE | Admit: 2019-03-11 | Discharge: 2019-03-11 | Disposition: A | Discharge status: Home / Self Care | Payer: BC Managed Care – PPO | Source: Ambulatory Visit | Attending: Family Medicine | Admitting: Family Medicine

## 2019-03-11 ENCOUNTER — Ambulatory Visit: Payer: BC Managed Care – PPO

## 2019-03-11 DIAGNOSIS — N644 Mastodynia: Secondary | ICD-10-CM

## 2019-03-11 DIAGNOSIS — R922 Inconclusive mammogram: Secondary | ICD-10-CM | POA: Diagnosis not present

## 2019-03-20 DIAGNOSIS — N201 Calculus of ureter: Secondary | ICD-10-CM | POA: Diagnosis not present

## 2019-04-02 DIAGNOSIS — M940 Chondrocostal junction syndrome [Tietze]: Secondary | ICD-10-CM | POA: Diagnosis not present

## 2019-04-02 DIAGNOSIS — N959 Unspecified menopausal and perimenopausal disorder: Secondary | ICD-10-CM | POA: Diagnosis not present

## 2019-04-02 DIAGNOSIS — J309 Allergic rhinitis, unspecified: Secondary | ICD-10-CM | POA: Diagnosis not present

## 2019-04-08 DIAGNOSIS — N200 Calculus of kidney: Secondary | ICD-10-CM | POA: Diagnosis not present

## 2019-04-15 ENCOUNTER — Ambulatory Visit: Payer: BC Managed Care – PPO | Attending: Internal Medicine

## 2019-04-15 DIAGNOSIS — Z23 Encounter for immunization: Secondary | ICD-10-CM

## 2019-04-15 NOTE — Progress Notes (Signed)
   Covid-19 Vaccination Clinic  Name:  TYKIA MELLONE    MRN: [id number] DOB: March 22, 1965  04/15/2019  Ms. Schoonmaker was observed post Covid-19 immunization for 15 minutes without incident. She was provided with Vaccine Information Sheet and instruction to access the V-Safe system.   Ms. Pence was instructed to call 911 with any severe reactions post vaccine: Marland Kitchen Difficulty breathing  . Swelling of face and throat  . A fast heartbeat  . A bad rash all over body  . Dizziness and weakness   Immunizations Administered    Name Date Dose VIS Date Route   Pfizer COVID-19 Vaccine 04/15/2019 12:20 PM 0.3 mL 12/21/2018 Intramuscular   Manufacturer: [employer], [employer]   Lot: [phone] [id number]   NDC: [address]-9

## 2019-04-17 IMAGING — MG 2D DIGITAL DIAGNOSTIC BILATERAL MAMMOGRAM WITH CAD AND ADJUNCT T
9 of 12 series · 9 of 28 positions shown · non-contrast
Comparison: Previous exam(s).

CLINICAL DATA: 51-year-old patient with bilateral breast pain, more
prominent on the right in the lower outer quadrant. She does not
palpate a lump.

EXAM:
2D DIGITAL DIAGNOSTIC BILATERAL MAMMOGRAM WITH CAD AND ADJUNCT TOMO

[R MLO synth-2D]
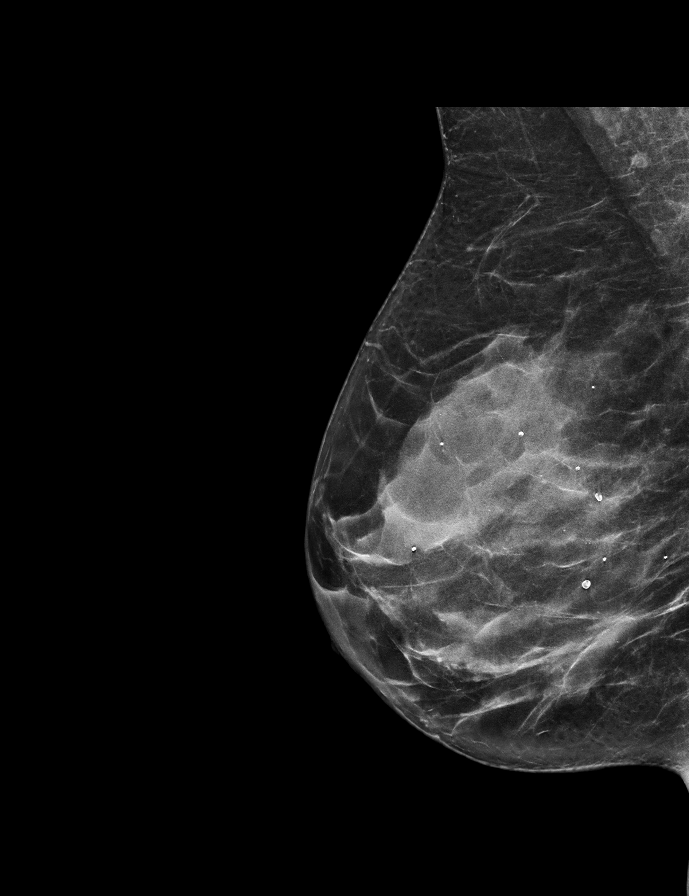

[L MLO synth-2D]
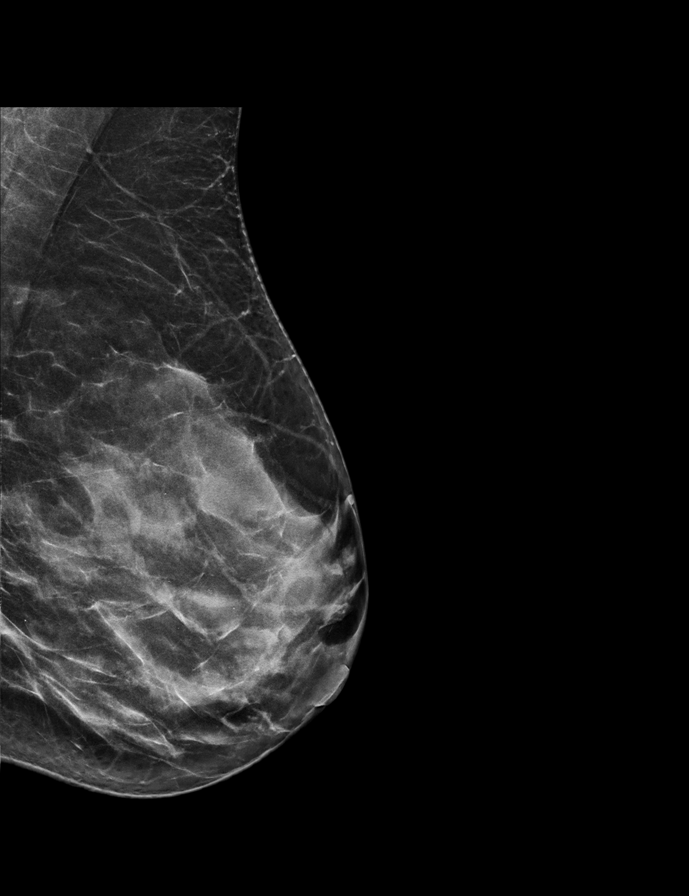

[R MLO]
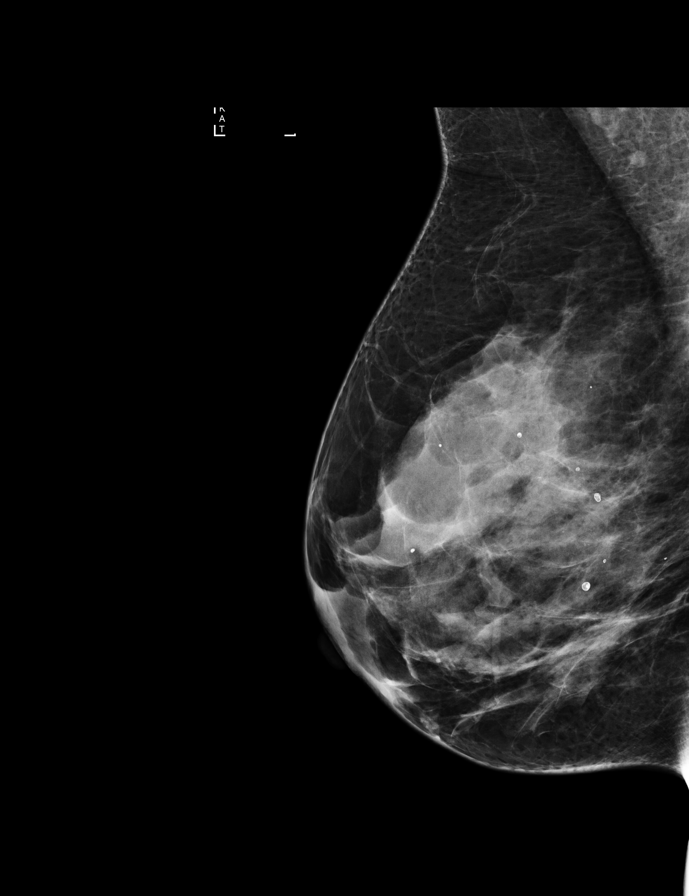

[L MLO]
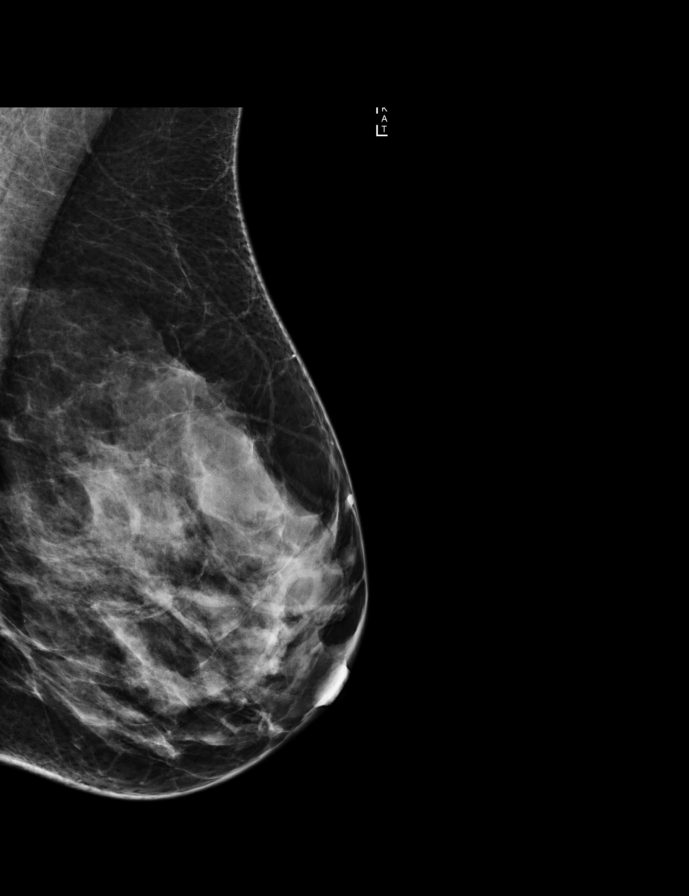

[R CC synth-2D]
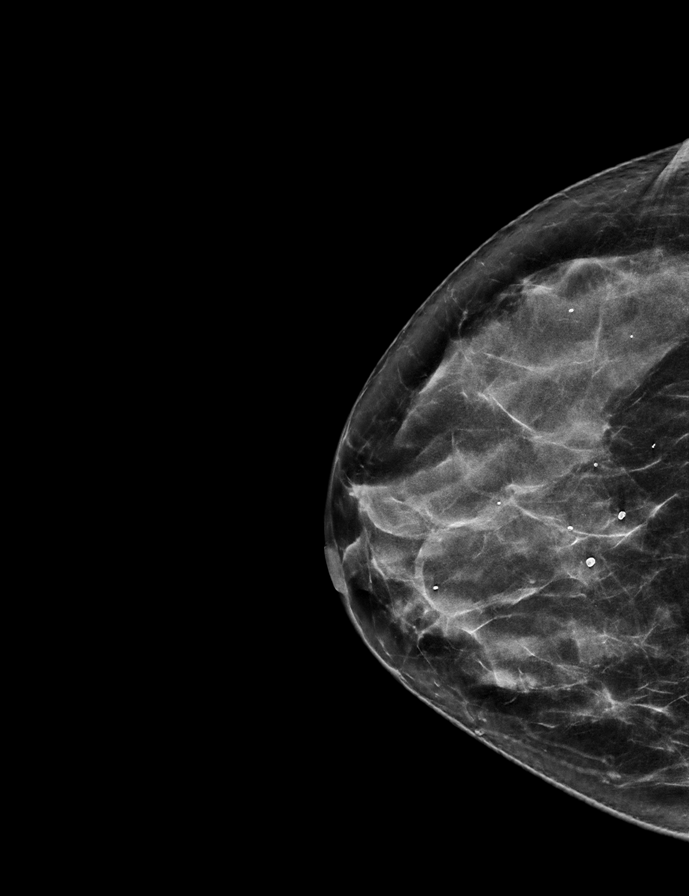

[L CC synth-2D]
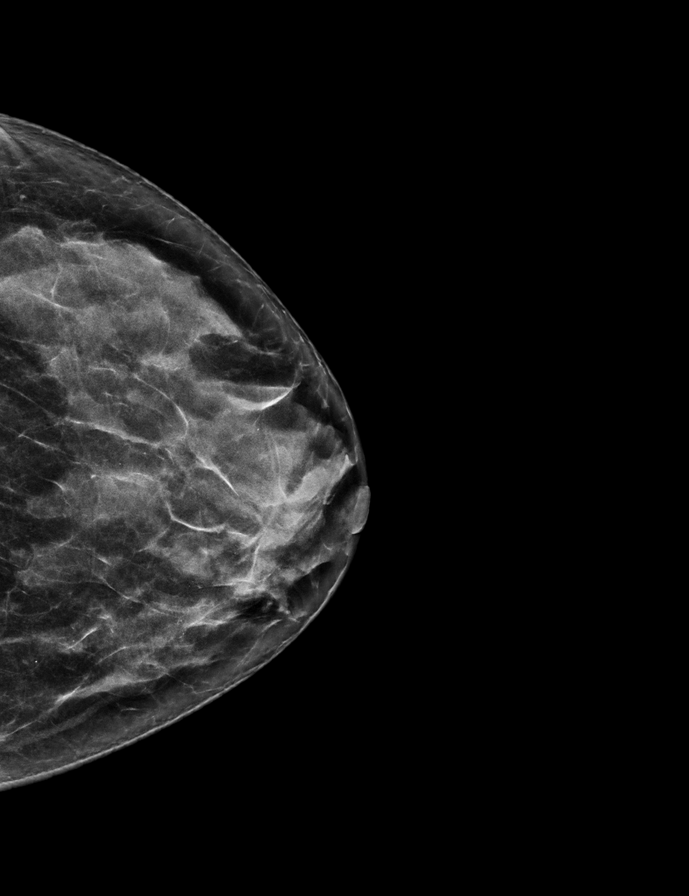

[L CC]
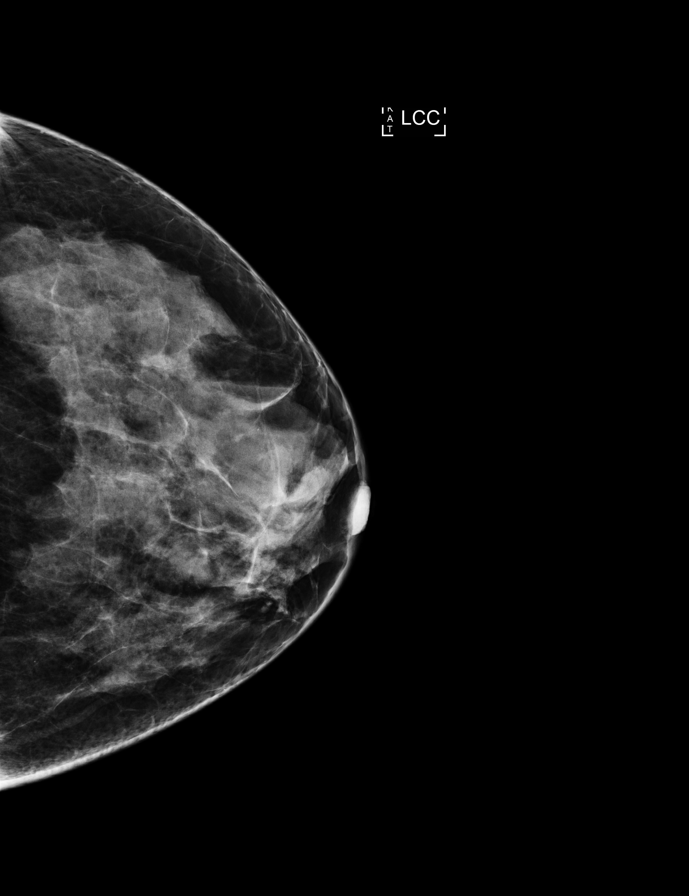

[R CC]
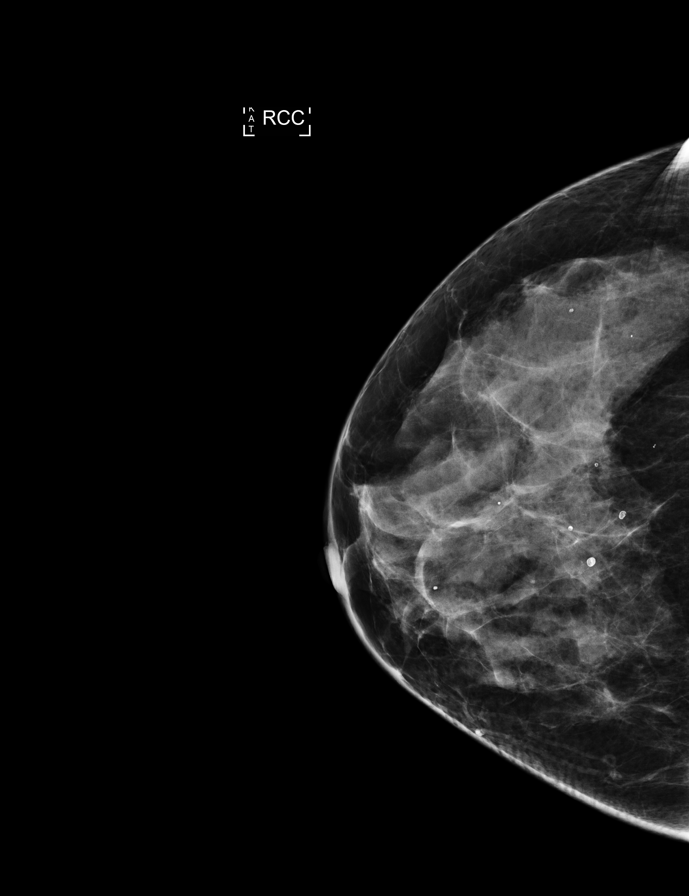

[L CC tomo · tomo slice 29/57.0]
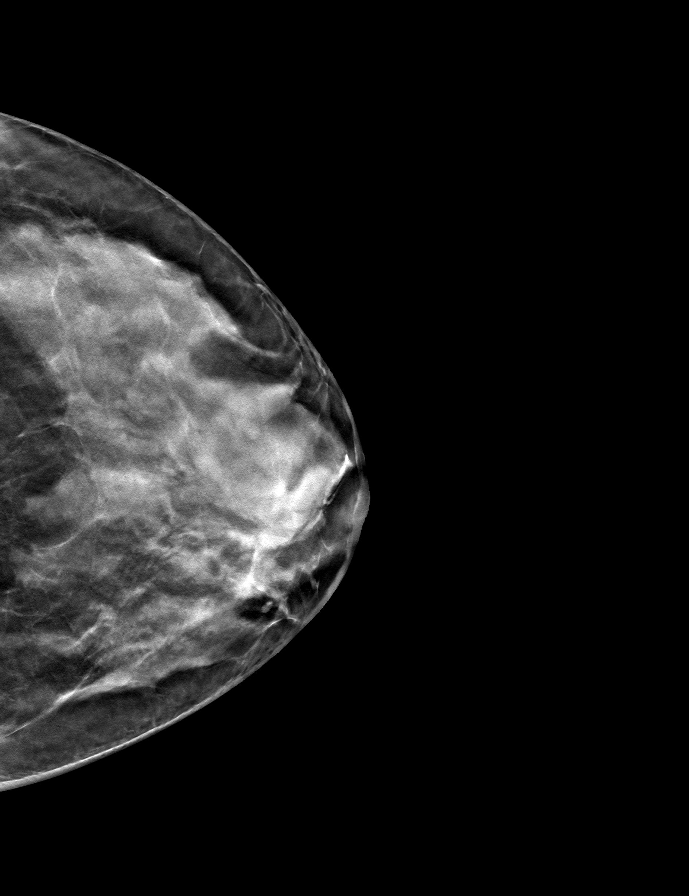

[9 of 28 positions shown; findings below may reference images not displayed]

ACR Breast Density Category c: The breast tissue is heterogeneously
dense, which may obscure small masses.
FINDINGS: No mass, architectural distortion, or suspicious microcalcification
is identified in either breast to suggest malignancy. There is a
stable parenchymal pattern bilaterally

Mammographic images were processed with CAD.
IMPRESSION: No evidence of malignancy in either breast.

RECOMMENDATION:
Screening mammogram in one year.(Code:[id number])

I have discussed the findings and recommendations with the patient.
Results were also provided in writing at the conclusion of the
visit. If applicable, a reminder letter will be sent to the patient
regarding the next appointment.

BI-RADS CATEGORY  1: Negative.

## 2019-05-14 ENCOUNTER — Ambulatory Visit: Payer: BC Managed Care – PPO | Attending: Internal Medicine

## 2019-05-14 DIAGNOSIS — Z23 Encounter for immunization: Secondary | ICD-10-CM

## 2019-05-14 NOTE — Progress Notes (Signed)
   Covid-19 Vaccination Clinic  Name:  Stephanie Shepard    MRN: [id number] DOB: [birth year]  05/14/2019  [name] was observed post Covid-19 immunization for 15 minutes without incident. She was provided with Vaccine Information Sheet and instruction to access the V-Safe system.   [name] was instructed to call 911 with any severe reactions post vaccine: Marland Kitchen Difficulty breathing  . Swelling of face and throat  . A fast heartbeat  . A bad rash all over body  . Dizziness and weakness   Immunizations Administered    Name Date Dose VIS Date Route   Pfizer COVID-19 Vaccine 05/14/2019  8:35 AM 0.3 mL 03/06/2018 Intramuscular   Manufacturer: [employer], [employer]   Lot: [id number]   NDC: [address]-6

## 2019-08-19 DIAGNOSIS — E7800 Pure hypercholesterolemia, unspecified: Secondary | ICD-10-CM | POA: Diagnosis not present

## 2019-08-19 DIAGNOSIS — E559 Vitamin D deficiency, unspecified: Secondary | ICD-10-CM | POA: Diagnosis not present

## 2019-08-19 DIAGNOSIS — E782 Mixed hyperlipidemia: Secondary | ICD-10-CM | POA: Diagnosis not present

## 2019-08-21 DIAGNOSIS — E559 Vitamin D deficiency, unspecified: Secondary | ICD-10-CM | POA: Diagnosis not present

## 2019-08-21 DIAGNOSIS — E782 Mixed hyperlipidemia: Secondary | ICD-10-CM | POA: Diagnosis not present

## 2019-08-21 DIAGNOSIS — J309 Allergic rhinitis, unspecified: Secondary | ICD-10-CM | POA: Diagnosis not present

## 2019-08-21 DIAGNOSIS — N959 Unspecified menopausal and perimenopausal disorder: Secondary | ICD-10-CM | POA: Diagnosis not present

## 2019-11-12 DIAGNOSIS — J309 Allergic rhinitis, unspecified: Secondary | ICD-10-CM | POA: Diagnosis not present

## 2019-11-12 DIAGNOSIS — E559 Vitamin D deficiency, unspecified: Secondary | ICD-10-CM | POA: Diagnosis not present

## 2019-11-12 DIAGNOSIS — E782 Mixed hyperlipidemia: Secondary | ICD-10-CM | POA: Diagnosis not present

## 2019-11-12 DIAGNOSIS — Z91018 Allergy to other foods: Secondary | ICD-10-CM | POA: Diagnosis not present

## 2019-11-19 DIAGNOSIS — E782 Mixed hyperlipidemia: Secondary | ICD-10-CM | POA: Diagnosis not present

## 2019-11-19 DIAGNOSIS — E559 Vitamin D deficiency, unspecified: Secondary | ICD-10-CM | POA: Diagnosis not present

## 2019-11-19 DIAGNOSIS — E1169 Type 2 diabetes mellitus with other specified complication: Secondary | ICD-10-CM | POA: Diagnosis not present

## 2019-11-19 DIAGNOSIS — J309 Allergic rhinitis, unspecified: Secondary | ICD-10-CM | POA: Diagnosis not present

## 2019-11-22 DIAGNOSIS — Z23 Encounter for immunization: Secondary | ICD-10-CM | POA: Diagnosis not present

## 2020-01-14 DIAGNOSIS — Z20828 Contact with and (suspected) exposure to other viral communicable diseases: Secondary | ICD-10-CM | POA: Diagnosis not present

## 2020-01-23 DIAGNOSIS — Z1152 Encounter for screening for COVID-19: Secondary | ICD-10-CM | POA: Diagnosis not present

## 2020-02-05 ENCOUNTER — Other Ambulatory Visit: Payer: Self-pay | Admitting: Gastroenterology

## 2020-02-05 DIAGNOSIS — R1011 Right upper quadrant pain: Secondary | ICD-10-CM | POA: Diagnosis not present

## 2020-02-05 DIAGNOSIS — Z1211 Encounter for screening for malignant neoplasm of colon: Secondary | ICD-10-CM | POA: Diagnosis not present

## 2020-02-05 DIAGNOSIS — K219 Gastro-esophageal reflux disease without esophagitis: Secondary | ICD-10-CM | POA: Diagnosis not present

## 2020-02-11 DIAGNOSIS — Z0000 Encounter for general adult medical examination without abnormal findings: Secondary | ICD-10-CM | POA: Diagnosis not present

## 2020-02-19 DIAGNOSIS — J309 Allergic rhinitis, unspecified: Secondary | ICD-10-CM | POA: Diagnosis not present

## 2020-02-19 DIAGNOSIS — R635 Abnormal weight gain: Secondary | ICD-10-CM | POA: Diagnosis not present

## 2020-02-19 DIAGNOSIS — E782 Mixed hyperlipidemia: Secondary | ICD-10-CM | POA: Diagnosis not present

## 2020-02-20 DIAGNOSIS — M791 Myalgia, unspecified site: Secondary | ICD-10-CM | POA: Diagnosis not present

## 2020-02-20 DIAGNOSIS — J309 Allergic rhinitis, unspecified: Secondary | ICD-10-CM | POA: Diagnosis not present

## 2020-02-20 DIAGNOSIS — Z1211 Encounter for screening for malignant neoplasm of colon: Secondary | ICD-10-CM | POA: Diagnosis not present

## 2020-02-20 DIAGNOSIS — Z0000 Encounter for general adult medical examination without abnormal findings: Secondary | ICD-10-CM | POA: Diagnosis not present

## 2020-02-21 NOTE — Progress Notes (Signed)
Attempted to obtain medical history via telephone, unable to reach at this time.  

## 2020-02-25 ENCOUNTER — Other Ambulatory Visit (HOSPITAL_COMMUNITY)
Admission: RE | Admit: 2020-02-25 | Discharge: 2020-02-25 | Disposition: A | Discharge status: Home / Self Care | Payer: BC Managed Care – PPO | Source: Ambulatory Visit | Attending: Gastroenterology | Admitting: Gastroenterology

## 2020-02-25 DIAGNOSIS — Z01812 Encounter for preprocedural laboratory examination: Secondary | ICD-10-CM | POA: Insufficient documentation

## 2020-02-25 DIAGNOSIS — Z800 Family history of malignant neoplasm of digestive organs: Secondary | ICD-10-CM | POA: Diagnosis not present

## 2020-02-25 DIAGNOSIS — Z880 Allergy status to penicillin: Secondary | ICD-10-CM | POA: Diagnosis not present

## 2020-02-25 DIAGNOSIS — R1011 Right upper quadrant pain: Secondary | ICD-10-CM | POA: Diagnosis not present

## 2020-02-25 DIAGNOSIS — Z20822 Contact with and (suspected) exposure to covid-19: Secondary | ICD-10-CM | POA: Insufficient documentation

## 2020-02-25 LAB — SARS CORONAVIRUS 2 (TAT 6-24 HRS): SARS Coronavirus 2: NEGATIVE

## 2020-02-28 ENCOUNTER — Encounter (HOSPITAL_COMMUNITY): Payer: Self-pay | Admitting: Gastroenterology

## 2020-02-28 ENCOUNTER — Encounter (HOSPITAL_COMMUNITY)
Admission: RE | Disposition: A | Discharge status: Home / Self Care | Payer: Self-pay | Source: Home / Self Care | Attending: Gastroenterology

## 2020-02-28 ENCOUNTER — Ambulatory Visit (HOSPITAL_COMMUNITY): Payer: BC Managed Care – PPO | Admitting: Anesthesiology

## 2020-02-28 ENCOUNTER — Ambulatory Visit (HOSPITAL_COMMUNITY)
Admission: RE | Admit: 2020-02-28 | Discharge: 2020-02-28 | Disposition: A | Discharge status: Home / Self Care | Payer: BC Managed Care – PPO | Attending: Gastroenterology | Admitting: Gastroenterology

## 2020-02-28 ENCOUNTER — Other Ambulatory Visit: Payer: Self-pay

## 2020-02-28 DIAGNOSIS — R112 Nausea with vomiting, unspecified: Secondary | ICD-10-CM | POA: Diagnosis not present

## 2020-02-28 DIAGNOSIS — Z20822 Contact with and (suspected) exposure to covid-19: Secondary | ICD-10-CM | POA: Diagnosis not present

## 2020-02-28 DIAGNOSIS — Z800 Family history of malignant neoplasm of digestive organs: Secondary | ICD-10-CM | POA: Diagnosis not present

## 2020-02-28 DIAGNOSIS — Z880 Allergy status to penicillin: Secondary | ICD-10-CM | POA: Diagnosis not present

## 2020-02-28 DIAGNOSIS — R1011 Right upper quadrant pain: Secondary | ICD-10-CM | POA: Insufficient documentation

## 2020-02-28 HISTORY — PX: ESOPHAGOGASTRODUODENOSCOPY (EGD) WITH PROPOFOL: SHX5813

## 2020-02-28 HISTORY — PX: UPPER ESOPHAGEAL ENDOSCOPIC ULTRASOUND (EUS): SHX6562

## 2020-02-28 SURGERY — UPPER ESOPHAGEAL ENDOSCOPIC ULTRASOUND (EUS)
Anesthesia: Monitor Anesthesia Care

## 2020-02-28 MED ORDER — ONDANSETRON HCL 4 MG/2ML IJ SOLN
INTRAMUSCULAR | Status: DC | PRN
  Administered 2020-02-28: 4 mg via INTRAVENOUS

## 2020-02-28 MED ORDER — DEXAMETHASONE SODIUM PHOSPHATE 10 MG/ML IJ SOLN
INTRAMUSCULAR | Status: DC | PRN
  Administered 2020-02-28: 10 mg via INTRAVENOUS

## 2020-02-28 MED ORDER — PROPOFOL 500 MG/50ML IV EMUL
INTRAVENOUS | Status: AC
  Filled 2020-02-28: qty 50

## 2020-02-28 MED ORDER — LIDOCAINE 2% (20 MG/ML) 5 ML SYRINGE
INTRAMUSCULAR | Status: DC | PRN
  Administered 2020-02-28: 40 mg via INTRAVENOUS

## 2020-02-28 MED ORDER — EPHEDRINE SULFATE-NACL 50-0.9 MG/10ML-% IV SOSY
PREFILLED_SYRINGE | INTRAVENOUS | Status: DC | PRN
  Administered 2020-02-28: 10 mg via INTRAVENOUS

## 2020-02-28 MED ORDER — PROPOFOL 1000 MG/100ML IV EMUL
INTRAVENOUS | Status: AC
  Filled 2020-02-28: qty 200

## 2020-02-28 MED ORDER — LACTATED RINGERS IV SOLN: INTRAVENOUS | Status: DC

## 2020-02-28 MED ORDER — PROPOFOL 10 MG/ML IV BOLUS
INTRAVENOUS | Status: DC | PRN
  Administered 2020-02-28 (×7): 20 mg via INTRAVENOUS
  Administered 2020-02-28: 50 mg via INTRAVENOUS
  Administered 2020-02-28 (×11): 20 mg via INTRAVENOUS

## 2020-02-28 MED ORDER — SODIUM CHLORIDE 0.9 % IV SOLN: INTRAVENOUS | Status: DC

## 2020-02-28 NOTE — H&P (Signed)
  Stephanie Shepard   HPI: At this time the patient denies any problems with nausea, vomiting, fevers, chills, diarrhea, constipation, hematochezia, melena, GERD, or dysphagia. The patient denies any known family history of colon cancers, but her two older sisters were diagnosed and treated for pancreatic cancer. Her mother also died of duodenal cancer. No complaints of chest pain, SOB, MI, or sleep apnea. She had a normal colonoscopy on 11/27/2014 for complaints of hematochezia. An EGD/EUS was performed to further evaluate her RUQ pain and her family history of pancreatic and duodenal cancer. The examination was also normal aside from some fundic gland polyps. She complains about an abdominal soreness, which is still in the same location as her prior RUQ pain complaint.   Past Medical History:  Diagnosis Date  . PONV (postoperative nausea and vomiting)     Past Surgical History:  Procedure Laterality Date  . APPENDECTOMY    . CESAREAN SECTION    . EUS N/A 12/12/2014   Procedure: FULL UPPER ENDOSCOPIC ULTRASOUND (EUS) RADIAL;  Surgeon: Jeani Hawking, MD;  Location: [facility];  Service: Endoscopy;  Laterality: N/A;  . TUBAL LIGATION      Family History  Problem Relation Age of Onset  . Breast cancer Sister 22  . Breast cancer Paternal Aunt        before 74  . Breast cancer Maternal Grandmother        late 68's  . Breast cancer Paternal Aunt        after 38    Social History:  reports that she has never smoked. She has never used smokeless tobacco. She reports that she does not drink alcohol and does not use drugs.  Allergies:  Allergies  Allergen Reactions  . Penicillins Hives    Has patient had a PCN reaction causing immediate rash, facial/tongue/throat swelling, SOB or lightheadedness with hypotension: HIVES (a week after taking the drug) Has patient had a PCN reaction causing severe rash involving mucus membranes or skin necrosis: No Has patient had a PCN reaction that required  hospitalization No Has patient had a PCN reaction occurring within the last 10 years: no If all of the above answers are "NO", then may proceed with Cephalosporin use.   . Shrimp [Shellfish Allergy] Other (See Comments)    Throat swelling    Medications:  Scheduled:  Continuous: . lactated ringers 10 mL/hr at 02/28/20 0727    No results found for this or any previous visit (from the past 24 hour(s)).   No results found.  ROS:  As stated above in the HPI otherwise negative.  Blood pressure 121/69, pulse (!) 59, temperature 98.6 F (37 C), temperature source Oral, resp. rate 10, height 5\' 3"  (1.6 m), weight 74.8 kg, last menstrual period 08/03/2018, SpO2 100 %.    PE: Gen: NAD, Alert and Oriented HEENT:  [place]/AT, EOMI Neck: Supple, no LAD Lungs: CTA Bilaterally CV: RRR without M/G/R ABD: Soft, NTND, +BS Ext: No C/C/E  Assessment/Plan: 1) RUQ pain - EGD/EUS  Fermon Ureta D 02/28/2020, 8:03 AM

## 2020-02-28 NOTE — Transfer of Care (Signed)
Immediate Anesthesia Transfer of Care Note  Patient: Stephanie Shepard  Procedure(s) Performed: UPPER ESOPHAGEAL ENDOSCOPIC ULTRASOUND (EUS) (N/A )  Patient Location: PACU and Endoscopy Unit  Anesthesia Type:MAC  Level of Consciousness: awake and alert   Airway & Oxygen Therapy: Patient Spontanous Breathing and Patient connected to face mask oxygen  Post-op Assessment: Report given to RN and Post -op Vital signs reviewed and stable  Post vital signs: Reviewed and stable  Last Vitals:  Vitals Value Taken Time  BP 96/54 02/28/20 0916  Temp 36.6 C 02/28/20 0916  Pulse 67 02/28/20 0917  Resp 18 02/28/20 0917  SpO2 99 % 02/28/20 0917  Vitals shown include unvalidated device data.  Last Pain:  Vitals:   02/28/20 0916  TempSrc: Axillary  PainSc: Asleep         Complications: No complications documented.

## 2020-02-28 NOTE — Anesthesia Procedure Notes (Signed)
Procedure Name: MAC Date/Time: 02/28/2020 8:24 AM Performed by: Cynda Familia, CRNA Pre-anesthesia Checklist: Patient identified, Emergency Drugs available, Suction available and Patient being monitored Patient Re-evaluated:Patient Re-evaluated prior to induction Oxygen Delivery Method: Simple face mask Placement Confirmation: positive ETCO2 and breath sounds checked- equal and bilateral Dental Injury: Teeth and Oropharynx as per pre-operative assessment

## 2020-02-28 NOTE — Op Note (Signed)
[facility] Patient Name: Stephanie Shepard Procedure Date: 02/28/2020 MRN: [id number] Attending MD: Jeani Hawking , MD Date of Birth: 1965/03/22 CSN: [id number] Age: 55 Admit Type: Outpatient Procedure:                Upper EUS Indications:              Abdominal pain in the right upper quadrant Providers:                Jeani Hawking, MD, Dwain Sarna, RN, Janie Billups,                            Technician, Harrington Challenger, Technician, Heron Nay, CRNA Referring MD:              Medicines:                Propofol per Anesthesia Complications:            No immediate complications. Estimated Blood Loss:     Estimated blood loss: none. Procedure:                Pre-Anesthesia Assessment:                           - Prior to the procedure, a History and Physical                            was performed, and patient medications and                            allergies were reviewed. The patient's tolerance of                            previous anesthesia was also reviewed. The risks                            and benefits of the procedure and the sedation                            options and risks were discussed with the patient.                            All questions were answered, and informed consent                            was obtained. Prior Anticoagulants: The patient has                            taken no previous anticoagulant or antiplatelet                            agents. ASA Grade Assessment: II - A patient with  mild systemic disease. After reviewing the risks                            and benefits, the patient was deemed in                            satisfactory condition to undergo the procedure.                           - Sedation was administered by an anesthesia                            professional. Deep sedation was attained.                           After obtaining informed consent, the  endoscope was                            passed under direct vision. Throughout the                            procedure, the patient's blood pressure, pulse, and                            oxygen saturations were monitored continuously. The                            [id number] ([id number] Olympus Linear EUS was                            introduced through the mouth, and advanced to the                            second part of duodenum. The [id number] ([id number])                            Olympus gastroscope was introduced through the                            mouth, and advanced to the second part of duodenum.                            The upper EUS was accomplished without difficulty.                            The patient tolerated the procedure well. Scope In: Scope Out: Findings:      ENDOSCOPIC FINDING: :      The examined esophagus was endoscopically normal.      The entire examined stomach was endoscopically normal.      The examined duodenum was endoscopically normal.      ENDOSONOGRAPHIC FINDING: :      There was no sign of significant endosonographic abnormality in the       pancreatic head, genu of the pancreas, pancreatic body, pancreatic tail  and uncinate process of the pancreas. The pancreatic duct measured up to       1 mm in diameter.      There was no sign of significant endosonographic abnormality in the       common bile duct. The maximum diameter of the duct was 5 mm. Impression:               - Normal esophagus.                           - Normal stomach.                           - Normal examined duodenum.                           - There was no sign of significant pathology in the                            pancreatic head, genu of the pancreas, pancreatic                            body, pancreatic tail and uncinate process of the                            pancreas.                           - There was no sign of significant pathology in the                             common bile duct.                           - No specimens collected. Moderate Sedation:      Not Applicable - Patient had care per Anesthesia. Recommendation:           - Patient has a contact number available for                            emergencies. The signs and symptoms of potential                            delayed complications were discussed with the                            patient. Return to normal activities tomorrow.                            Written discharge instructions were provided to the                            patient.                           - Resume regular diet. Procedure Code(s):        --- Professional ---  66815, Esophagogastroduodenoscopy, flexible,                            transoral; with endoscopic ultrasound examination                            limited to the esophagus, stomach or duodenum, and                            adjacent structures Diagnosis Code(s):        --- Professional ---                           R10.11, Right upper quadrant pain CPT copyright 2019 American Medical Association. All rights reserved. The codes documented in this report are preliminary and upon coder review may  be revised to meet current compliance requirements. Jeani Hawking, MD Jeani Hawking, MD 02/28/2020 9:15:22 AM This report has been signed electronically. Number of Addenda: 0

## 2020-02-28 NOTE — Anesthesia Preprocedure Evaluation (Signed)
Anesthesia Evaluation  Patient identified by MRN, date of birth, ID band Patient awake    Reviewed: Allergy & Precautions, NPO status , Patient's Chart, lab work & pertinent test results  History of Anesthesia Complications (+) PONV and history of anesthetic complications  Airway Mallampati: II  TM Distance: >3 FB Neck ROM: Full    Dental  (+) Teeth Intact, Dental Advisory Given   Pulmonary neg pulmonary ROS,    Pulmonary exam normal breath sounds clear to auscultation       Cardiovascular negative cardio ROS Normal cardiovascular exam Rhythm:Regular Rate:Normal     Neuro/Psych negative neurological ROS  negative psych ROS   GI/Hepatic negative GI ROS, RUQ pain/family history of pancreatic cancer   Endo/Other  negative endocrine ROS  Renal/GU negative Renal ROS     Musculoskeletal negative musculoskeletal ROS (+)   Abdominal   Peds  Hematology negative hematology ROS (+)   Anesthesia Other Findings Day of surgery medications reviewed with the patient.  Reproductive/Obstetrics                             Anesthesia Physical Anesthesia Plan  ASA: I  Anesthesia Plan: MAC   Post-op Pain Management:    Induction: Intravenous  PONV Risk Score and Plan: 3 and Propofol infusion, Dexamethasone and Ondansetron  Airway Management Planned: Nasal Cannula and Natural Airway  Additional Equipment:   Intra-op Plan:   Post-operative Plan:   Informed Consent: I have reviewed the patients History and Physical, chart, labs and discussed the procedure including the risks, benefits and alternatives for the proposed anesthesia with the patient or authorized representative who has indicated his/her understanding and acceptance.     Dental advisory given  Plan Discussed with: CRNA and Anesthesiologist  Anesthesia Plan Comments:         Anesthesia Quick Evaluation

## 2020-02-28 NOTE — Discharge Instructions (Signed)

## 2020-02-28 NOTE — Anesthesia Postprocedure Evaluation (Signed)
Anesthesia Post Note  Patient: Stephanie Shepard  Procedure(s) Performed: UPPER ESOPHAGEAL ENDOSCOPIC ULTRASOUND (EUS) (N/A )     Patient location during evaluation: PACU Anesthesia Type: MAC Level of consciousness: awake and alert Pain management: pain level controlled Vital Signs Assessment: post-procedure vital signs reviewed and stable Respiratory status: spontaneous breathing, nonlabored ventilation and respiratory function stable Cardiovascular status: stable and blood pressure returned to baseline Postop Assessment: no apparent nausea or vomiting Anesthetic complications: no   No complications documented.  Last Vitals:  Vitals:   02/28/20 0920 02/28/20 0930  BP: (!) 105/50 (!) 101/57  Pulse: 62 63  Resp: (!) 21 17  Temp:    SpO2: 98% 97%    Last Pain:  Vitals:   02/28/20 0930  TempSrc:   PainSc: 0-No pain                 Catalina Gravel

## 2020-03-03 ENCOUNTER — Encounter (HOSPITAL_COMMUNITY): Payer: Self-pay | Admitting: Gastroenterology

## 2020-03-05 DIAGNOSIS — Z1211 Encounter for screening for malignant neoplasm of colon: Secondary | ICD-10-CM | POA: Diagnosis not present

## 2020-03-10 ENCOUNTER — Other Ambulatory Visit: Payer: Self-pay | Admitting: Obstetrics & Gynecology

## 2020-03-10 DIAGNOSIS — Z1231 Encounter for screening mammogram for malignant neoplasm of breast: Secondary | ICD-10-CM

## 2020-04-06 DIAGNOSIS — Z01419 Encounter for gynecological examination (general) (routine) without abnormal findings: Secondary | ICD-10-CM | POA: Diagnosis not present

## 2020-04-06 DIAGNOSIS — Z6830 Body mass index (BMI) 30.0-30.9, adult: Secondary | ICD-10-CM | POA: Diagnosis not present

## 2020-04-07 DIAGNOSIS — Z87442 Personal history of urinary calculi: Secondary | ICD-10-CM | POA: Diagnosis not present

## 2020-04-07 DIAGNOSIS — N281 Cyst of kidney, acquired: Secondary | ICD-10-CM | POA: Diagnosis not present

## 2020-04-07 DIAGNOSIS — D259 Leiomyoma of uterus, unspecified: Secondary | ICD-10-CM | POA: Diagnosis not present

## 2020-04-07 DIAGNOSIS — N139 Obstructive and reflux uropathy, unspecified: Secondary | ICD-10-CM | POA: Diagnosis not present

## 2020-04-07 DIAGNOSIS — R3129 Other microscopic hematuria: Secondary | ICD-10-CM | POA: Diagnosis not present

## 2020-04-07 DIAGNOSIS — R1084 Generalized abdominal pain: Secondary | ICD-10-CM | POA: Diagnosis not present

## 2020-04-10 DIAGNOSIS — E782 Mixed hyperlipidemia: Secondary | ICD-10-CM | POA: Diagnosis not present

## 2020-04-10 DIAGNOSIS — M791 Myalgia, unspecified site: Secondary | ICD-10-CM | POA: Diagnosis not present

## 2020-04-16 DIAGNOSIS — M791 Myalgia, unspecified site: Secondary | ICD-10-CM | POA: Diagnosis not present

## 2020-04-16 DIAGNOSIS — N390 Urinary tract infection, site not specified: Secondary | ICD-10-CM | POA: Diagnosis not present

## 2020-04-16 DIAGNOSIS — E782 Mixed hyperlipidemia: Secondary | ICD-10-CM | POA: Diagnosis not present

## 2020-04-16 DIAGNOSIS — E663 Overweight: Secondary | ICD-10-CM | POA: Diagnosis not present

## 2020-05-04 ENCOUNTER — Ambulatory Visit: Payer: BC Managed Care – PPO

## 2020-06-15 DIAGNOSIS — Z23 Encounter for immunization: Secondary | ICD-10-CM | POA: Diagnosis not present

## 2020-06-25 ENCOUNTER — Other Ambulatory Visit: Payer: Self-pay

## 2020-06-25 ENCOUNTER — Ambulatory Visit
Admission: RE | Admit: 2020-06-25 | Discharge: 2020-06-25 | Disposition: A | Discharge status: Home / Self Care | Payer: BC Managed Care – PPO | Source: Ambulatory Visit | Attending: Obstetrics & Gynecology | Admitting: Obstetrics & Gynecology

## 2020-06-25 DIAGNOSIS — Z1231 Encounter for screening mammogram for malignant neoplasm of breast: Secondary | ICD-10-CM

## 2020-07-15 DIAGNOSIS — H9190 Unspecified hearing loss, unspecified ear: Secondary | ICD-10-CM | POA: Diagnosis not present

## 2020-07-15 DIAGNOSIS — R42 Dizziness and giddiness: Secondary | ICD-10-CM | POA: Diagnosis not present

## 2020-07-15 DIAGNOSIS — J309 Allergic rhinitis, unspecified: Secondary | ICD-10-CM | POA: Diagnosis not present

## 2020-07-15 DIAGNOSIS — H6590 Unspecified nonsuppurative otitis media, unspecified ear: Secondary | ICD-10-CM | POA: Diagnosis not present

## 2020-08-05 DIAGNOSIS — M791 Myalgia, unspecified site: Secondary | ICD-10-CM | POA: Diagnosis not present

## 2020-08-05 DIAGNOSIS — R5383 Other fatigue: Secondary | ICD-10-CM | POA: Diagnosis not present

## 2020-08-05 DIAGNOSIS — Z6820 Body mass index (BMI) 20.0-20.9, adult: Secondary | ICD-10-CM | POA: Diagnosis not present

## 2020-08-18 ENCOUNTER — Other Ambulatory Visit: Payer: Self-pay | Admitting: Family Medicine

## 2020-08-18 DIAGNOSIS — R109 Unspecified abdominal pain: Secondary | ICD-10-CM

## 2020-08-18 DIAGNOSIS — E782 Mixed hyperlipidemia: Secondary | ICD-10-CM | POA: Diagnosis not present

## 2020-08-18 DIAGNOSIS — E663 Overweight: Secondary | ICD-10-CM | POA: Diagnosis not present

## 2020-08-18 DIAGNOSIS — M791 Myalgia, unspecified site: Secondary | ICD-10-CM | POA: Diagnosis not present

## 2020-09-01 ENCOUNTER — Ambulatory Visit
Admission: RE | Admit: 2020-09-01 | Discharge: 2020-09-01 | Disposition: A | Discharge status: Home / Self Care | Payer: BC Managed Care – PPO | Source: Ambulatory Visit | Attending: Family Medicine | Admitting: Family Medicine

## 2020-09-01 DIAGNOSIS — K769 Liver disease, unspecified: Secondary | ICD-10-CM | POA: Diagnosis not present

## 2020-09-01 DIAGNOSIS — N281 Cyst of kidney, acquired: Secondary | ICD-10-CM | POA: Diagnosis not present

## 2020-09-01 DIAGNOSIS — R109 Unspecified abdominal pain: Secondary | ICD-10-CM

## 2020-09-08 DIAGNOSIS — F411 Generalized anxiety disorder: Secondary | ICD-10-CM | POA: Diagnosis not present

## 2020-09-08 DIAGNOSIS — R109 Unspecified abdominal pain: Secondary | ICD-10-CM | POA: Diagnosis not present

## 2020-09-08 DIAGNOSIS — R932 Abnormal findings on diagnostic imaging of liver and biliary tract: Secondary | ICD-10-CM | POA: Diagnosis not present

## 2020-09-08 DIAGNOSIS — F439 Reaction to severe stress, unspecified: Secondary | ICD-10-CM | POA: Diagnosis not present

## 2020-09-10 DIAGNOSIS — Z23 Encounter for immunization: Secondary | ICD-10-CM | POA: Diagnosis not present

## 2020-10-06 DIAGNOSIS — G4700 Insomnia, unspecified: Secondary | ICD-10-CM | POA: Diagnosis not present

## 2020-10-06 DIAGNOSIS — N951 Menopausal and female climacteric states: Secondary | ICD-10-CM | POA: Diagnosis not present

## 2020-10-06 DIAGNOSIS — F331 Major depressive disorder, recurrent, moderate: Secondary | ICD-10-CM | POA: Diagnosis not present

## 2020-10-06 DIAGNOSIS — F411 Generalized anxiety disorder: Secondary | ICD-10-CM | POA: Diagnosis not present

## 2020-10-11 DIAGNOSIS — A084 Viral intestinal infection, unspecified: Secondary | ICD-10-CM | POA: Diagnosis not present

## 2020-10-11 DIAGNOSIS — R197 Diarrhea, unspecified: Secondary | ICD-10-CM | POA: Diagnosis not present

## 2021-01-15 DIAGNOSIS — R140 Abdominal distension (gaseous): Secondary | ICD-10-CM | POA: Diagnosis not present

## 2021-01-15 DIAGNOSIS — R197 Diarrhea, unspecified: Secondary | ICD-10-CM | POA: Diagnosis not present

## 2021-01-15 DIAGNOSIS — Z6828 Body mass index (BMI) 28.0-28.9, adult: Secondary | ICD-10-CM | POA: Diagnosis not present

## 2021-01-15 DIAGNOSIS — R109 Unspecified abdominal pain: Secondary | ICD-10-CM | POA: Diagnosis not present

## 2021-02-23 DIAGNOSIS — Z23 Encounter for immunization: Secondary | ICD-10-CM | POA: Diagnosis not present

## 2021-02-23 DIAGNOSIS — Z0000 Encounter for general adult medical examination without abnormal findings: Secondary | ICD-10-CM | POA: Diagnosis not present

## 2021-04-05 DIAGNOSIS — K529 Noninfective gastroenteritis and colitis, unspecified: Secondary | ICD-10-CM | POA: Diagnosis not present

## 2021-04-05 DIAGNOSIS — R140 Abdominal distension (gaseous): Secondary | ICD-10-CM | POA: Diagnosis not present

## 2021-04-05 DIAGNOSIS — Z6828 Body mass index (BMI) 28.0-28.9, adult: Secondary | ICD-10-CM | POA: Diagnosis not present

## 2021-05-31 DIAGNOSIS — Z01419 Encounter for gynecological examination (general) (routine) without abnormal findings: Secondary | ICD-10-CM | POA: Diagnosis not present

## 2021-05-31 DIAGNOSIS — Z6827 Body mass index (BMI) 27.0-27.9, adult: Secondary | ICD-10-CM | POA: Diagnosis not present

## 2021-05-31 DIAGNOSIS — R1084 Generalized abdominal pain: Secondary | ICD-10-CM | POA: Diagnosis not present

## 2021-05-31 DIAGNOSIS — N139 Obstructive and reflux uropathy, unspecified: Secondary | ICD-10-CM | POA: Diagnosis not present

## 2021-06-01 DIAGNOSIS — Z13820 Encounter for screening for osteoporosis: Secondary | ICD-10-CM | POA: Diagnosis not present

## 2021-09-17 DIAGNOSIS — D2372 Other benign neoplasm of skin of left lower limb, including hip: Secondary | ICD-10-CM | POA: Diagnosis not present

## 2021-09-17 DIAGNOSIS — L708 Other acne: Secondary | ICD-10-CM | POA: Diagnosis not present

## 2021-09-17 DIAGNOSIS — L738 Other specified follicular disorders: Secondary | ICD-10-CM | POA: Diagnosis not present

## 2021-09-17 DIAGNOSIS — L720 Epidermal cyst: Secondary | ICD-10-CM | POA: Diagnosis not present

## 2021-12-15 DIAGNOSIS — J018 Other acute sinusitis: Secondary | ICD-10-CM | POA: Diagnosis not present

## 2021-12-15 DIAGNOSIS — Z20822 Contact with and (suspected) exposure to covid-19: Secondary | ICD-10-CM | POA: Diagnosis not present

## 2021-12-15 DIAGNOSIS — E785 Hyperlipidemia, unspecified: Secondary | ICD-10-CM | POA: Diagnosis not present

## 2021-12-15 DIAGNOSIS — U071 COVID-19: Secondary | ICD-10-CM | POA: Diagnosis not present

## 2021-12-15 DIAGNOSIS — R519 Headache, unspecified: Secondary | ICD-10-CM | POA: Diagnosis not present

## 2021-12-15 DIAGNOSIS — J069 Acute upper respiratory infection, unspecified: Secondary | ICD-10-CM | POA: Diagnosis not present
